# Patient Record
Sex: Female | Born: 1975 | Race: White | Hispanic: No | Marital: Single | Smoking: Never smoker
Health system: Southern US, Community
[De-identification: ages and names within clinical notes are randomized; demographics above are authoritative.]

## PROBLEM LIST (undated history)

## (undated) ENCOUNTER — Ambulatory Visit: Admission: EM | Payer: Self-pay | Source: Home / Self Care

## (undated) DIAGNOSIS — N390 Urinary tract infection, site not specified: Secondary | ICD-10-CM

## (undated) HISTORY — PX: APPENDECTOMY: SHX54

## (undated) HISTORY — PX: ABLATION: SHX5711

---

## 2013-01-12 ENCOUNTER — Emergency Department: Payer: Self-pay | Admitting: Emergency Medicine

## 2013-05-25 ENCOUNTER — Emergency Department: Payer: Self-pay | Admitting: Emergency Medicine

## 2014-08-02 ENCOUNTER — Emergency Department: Payer: Self-pay | Admitting: Emergency Medicine

## 2015-01-24 ENCOUNTER — Emergency Department
Admit: 2015-01-24 | Disposition: A | Discharge status: Home / Self Care | Payer: Self-pay | Admitting: Emergency Medicine

## 2015-01-24 LAB — URINALYSIS, COMPLETE
Bilirubin,UR: NEGATIVE
Glucose,UR: NEGATIVE mg/dL (ref 0–75)
KETONE: NEGATIVE
Nitrite: NEGATIVE
PH: 5 (ref 4.5–8.0)
SPECIFIC GRAVITY: 1.023 (ref 1.003–1.030)

## 2015-03-11 ENCOUNTER — Emergency Department
Admission: EM | Admit: 2015-03-11 | Discharge: 2015-03-11 | Disposition: A | Discharge status: Home / Self Care | Payer: Medicaid Other | Attending: Emergency Medicine | Admitting: Emergency Medicine

## 2015-03-11 ENCOUNTER — Encounter: Payer: Self-pay | Admitting: Emergency Medicine

## 2015-03-11 DIAGNOSIS — Z87440 Personal history of urinary (tract) infections: Secondary | ICD-10-CM | POA: Insufficient documentation

## 2015-03-11 DIAGNOSIS — Z3202 Encounter for pregnancy test, result negative: Secondary | ICD-10-CM | POA: Diagnosis not present

## 2015-03-11 DIAGNOSIS — R300 Dysuria: Secondary | ICD-10-CM | POA: Insufficient documentation

## 2015-03-11 HISTORY — DX: Urinary tract infection, site not specified: N39.0

## 2015-03-11 LAB — URINALYSIS COMPLETE WITH MICROSCOPIC (ARMC ONLY)
Bacteria, UA: NONE SEEN
Bilirubin Urine: NEGATIVE
Glucose, UA: NEGATIVE mg/dL
Hgb urine dipstick: NEGATIVE
Ketones, ur: NEGATIVE mg/dL
LEUKOCYTES UA: NEGATIVE
Nitrite: NEGATIVE
Protein, ur: NEGATIVE mg/dL
SPECIFIC GRAVITY, URINE: 1.001 — AB (ref 1.005–1.030)
pH: 6 (ref 5.0–8.0)

## 2015-03-11 LAB — POCT PREGNANCY, URINE: PREG TEST UR: NEGATIVE

## 2015-03-11 MED ORDER — PHENAZOPYRIDINE HCL 200 MG PO TABS
ORAL_TABLET | ORAL | Status: AC
  Administered 2015-03-11: 200 mg via ORAL
  Filled 2015-03-11: qty 1

## 2015-03-11 MED ORDER — PHENAZOPYRIDINE HCL 200 MG PO TABS
200.0000 mg | ORAL_TABLET | Freq: Once | ORAL | Status: AC
  Administered 2015-03-11: 200 mg via ORAL

## 2015-03-11 NOTE — ED Provider Notes (Signed)
[facility] Emergency Department Provider Note  ____________________________________________  Time seen: 4:20 AM  I have reviewed the triage vital signs and the nursing notes.   HISTORY  Chief Complaint Urinary Tract Infection      HPI Meagan Guzman is a 39 y.o. female presents with dysuria times one hour. Patient denies any fever no nausea no vomiting no back pain. Patient admits to multiple urinary tract infections most recent of which was last month.     Past Medical History  Diagnosis Date  . UTI (lower urinary tract infection)     There are no active problems to display for this patient.   Past Surgical History  Procedure Laterality Date  . Ablation    . Appendectomy      No current outpatient prescriptions on file.  Allergies Review of patient's allergies indicates no known allergies.  History reviewed. No pertinent family history.  Social History History  Substance Use Topics  . Smoking status: Never Smoker   . Smokeless tobacco: Not on file  . Alcohol Use: Yes    Review of Systems  Constitutional: Negative for fever. Eyes: Negative for visual changes. ENT: Negative for sore throat. Cardiovascular: Negative for chest pain. Respiratory: Negative for shortness of breath. Gastrointestinal: Negative for abdominal pain, vomiting and diarrhea. Genitourinary: Negative for dysuria. Musculoskeletal: Negative for back pain. Skin: Negative for rash. Neurological: Negative for headaches, focal weakness or numbness.   10-point ROS otherwise negative.  ____________________________________________   PHYSICAL EXAM:  VITAL SIGNS: ED Triage Vitals  Enc Vitals Group     BP 03/11/15 0150 98/65 mmHg     Pulse Rate 03/11/15 0150 70     Resp 03/11/15 0150 18     Temp 03/11/15 0150 97.7 F (36.5 C)     Temp Source 03/11/15 0150 Oral     SpO2 03/11/15 0150 97 %     Weight 03/11/15 0150 188 lb (85.276 kg)     Height 03/11/15 0150   (1.626 m)     Head Cir --      Peak Flow --      Pain Score 03/11/15 0153 3     Pain Loc --      Pain Edu? --      Excl. in GC? --      Constitutional: Alert and oriented. Well appearing and in no distress. Eyes: Conjunctivae are normal. PERRL. Normal extraocular movements. ENT   Head: Normocephalic and atraumatic.   Nose: No congestion/rhinnorhea.   Mouth/Throat: Mucous membranes are moist.   Neck: No stridor. Cardiovascular: Normal rate, regular rhythm. Normal and symmetric distal pulses are present in all extremities. No murmurs, rubs, or gallops. Respiratory: Normal respiratory effort without tachypnea nor retractions. Breath sounds are clear and equal bilaterally. No wheezes/rales/rhonchi. Gastrointestinal: Soft and nontender. No distention. There is no CVA tenderness. Genitourinary: deferred Musculoskeletal: Nontender with normal range of motion in all extremities. No joint effusions.  No lower extremity tenderness nor edema. Neurologic:  Normal speech and language. No gross focal neurologic deficits are appreciated. Speech is normal.  Skin:  Skin is warm, dry and intact. No rash noted. Psychiatric: Mood and affect are normal. Speech and behavior are normal. Patient exhibits appropriate insight and judgment.  ____________________________________________    LABS (pertinent positives/negatives)  Labs Reviewed  URINALYSIS COMPLETEWITH MICROSCOPIC ([facility] ONLY) - Abnormal; Notable for the following:    Color, Urine COLORLESS (*)    APPearance CLEAR (*)    Specific Gravity, Urine 1.001 (*)  Squamous Epithelial / LPF 0-5 (*)    All other components within normal limits  POC URINE PREG, ED  POCT PREGNANCY, URINE     ____________________________________________     INITIAL IMPRESSION / ASSESSMENT AND PLAN / ED COURSE  Pertinent labs & imaging results that were available during my care of the patient were reviewed by me and considered in my medical  decision making (see chart for details).  History of physical exam consistent with dysuria however urinalysis revealed no evidence of urinary tract infection. Patient stated that she just had a Pap smear done with cultures taken on Friday and she is awaiting results as such repeat Pap exam not performed. Urged patient to follow up on results from Dr. Crista CurbEvers regarding her Pap smear.  ____________________________________________   FINAL CLINICAL IMPRESSION(S) / ED DIAGNOSES  Final diagnoses:  Dysuria      Darci Currentandolph N Elvera Almario, MD 03/11/15 [phone]

## 2015-03-11 NOTE — ED Notes (Signed)
Pt states she had a UTI in April/May and completed all her antibiotics.

## 2015-03-11 NOTE — Discharge Instructions (Signed)

## 2015-03-11 NOTE — ED Notes (Signed)
Pt arrived to the ED for complaints of dysuria. Pt states: "I have a UTI, I get them regularly and it always start like this." Pt reports having an UTI last month. Pt is AOx4 in no apparent distress.

## 2015-04-15 ENCOUNTER — Encounter: Payer: Self-pay | Admitting: Emergency Medicine

## 2015-04-15 ENCOUNTER — Emergency Department: Payer: Medicaid Other

## 2015-04-15 DIAGNOSIS — M79641 Pain in right hand: Secondary | ICD-10-CM | POA: Insufficient documentation

## 2015-04-15 NOTE — ED Notes (Signed)
Pt presents to ER alert and in NAD. pt reports hand pain intermittently over the last few days, Denies injury. Pt moving hand normally in triage.

## 2015-04-16 ENCOUNTER — Emergency Department
Admission: EM | Admit: 2015-04-16 | Discharge: 2015-04-16 | Discharge status: Against Medical Advice | Payer: Medicaid Other | Attending: Student | Admitting: Student

## 2015-06-22 ENCOUNTER — Emergency Department
Admission: EM | Admit: 2015-06-22 | Discharge: 2015-06-22 | Disposition: A | Discharge status: Home / Self Care | Payer: Medicaid Other | Attending: Emergency Medicine | Admitting: Emergency Medicine

## 2015-06-22 DIAGNOSIS — N390 Urinary tract infection, site not specified: Secondary | ICD-10-CM | POA: Insufficient documentation

## 2015-06-22 LAB — URINALYSIS COMPLETE WITH MICROSCOPIC (ARMC ONLY): SPECIFIC GRAVITY, URINE: 1.008 (ref 1.005–1.030)

## 2015-06-22 MED ORDER — SULFAMETHOXAZOLE-TRIMETHOPRIM 800-160 MG PO TABS
1.0000 | ORAL_TABLET | Freq: Once | ORAL | Status: AC
  Administered 2015-06-22: 1 via ORAL
  Filled 2015-06-22: qty 1

## 2015-06-22 MED ORDER — SULFAMETHOXAZOLE-TRIMETHOPRIM 800-160 MG PO TABS: 1.0000 | ORAL_TABLET | Freq: Two times a day (BID) | ORAL | Status: DC

## 2015-06-22 NOTE — ED Notes (Signed)
Pt presents with c/o urinary frequency and dysuria x 3 days,   She started taking AZO with no relief.   Today she is also c/o pain right lower back.

## 2015-06-22 NOTE — Discharge Instructions (Signed)
Urinary Tract Infection Urinary tract infections (UTIs) can develop anywhere along your urinary tract. Your urinary tract is your body's drainage system for removing wastes and extra water. Your urinary tract includes two kidneys, two ureters, a bladder, and a urethra. Your kidneys are a pair of bean-shaped organs. Each kidney is about the size of your fist. They are located below your ribs, one on each side of your spine. CAUSES Infections are caused by microbes, which are microscopic organisms, including fungi, viruses, and bacteria. These organisms are so small that they can only be seen through a microscope. Bacteria are the microbes that most commonly cause UTIs. SYMPTOMS  Symptoms of UTIs may vary by age and gender of the patient and by the location of the infection. Symptoms in young women typically include a frequent and intense urge to urinate and a painful, burning feeling in the bladder or urethra during urination. Older women and men are more likely to be tired, shaky, and weak and have muscle aches and abdominal pain. A fever may mean the infection is in your kidneys. Other symptoms of a kidney infection include pain in your back or sides below the ribs, nausea, and vomiting. DIAGNOSIS To diagnose a UTI, your caregiver will ask you about your symptoms. Your caregiver also will ask to provide a urine sample. The urine sample will be tested for bacteria and white blood cells. White blood cells are made by your body to help fight infection. TREATMENT  Typically, UTIs can be treated with medication. Because most UTIs are caused by a bacterial infection, they usually can be treated with the use of antibiotics. The choice of antibiotic and length of treatment depend on your symptoms and the type of bacteria causing your infection. HOME CARE INSTRUCTIONS  If you were prescribed antibiotics, take them exactly as your caregiver instructs you. Finish the medication even if you feel better after you  have only taken some of the medication.  Drink enough water and fluids to keep your urine clear or pale yellow.  Avoid caffeine, tea, and carbonated beverages. They tend to irritate your bladder.  Empty your bladder often. Avoid holding urine for long periods of time.  Empty your bladder before and after sexual intercourse.  After a bowel movement, women should cleanse from front to back. Use each tissue only once. SEEK MEDICAL CARE IF:   You have back pain.  You develop a fever.  Your symptoms do not begin to resolve within 3 days. SEEK IMMEDIATE MEDICAL CARE IF:   You have severe back pain or lower abdominal pain.  You develop chills.  You have nausea or vomiting.  You have continued burning or discomfort with urination. MAKE SURE YOU:   Understand these instructions.  Will watch your condition.  Will get help right away if you are not doing well or get worse. Document Released: 06/30/2005 Document Revised: 03/21/2012 Document Reviewed: 10/29/2011 [facility] Patient Information 2015 [place], [place]. This information is not intended to replace advice given to you by your health care provider. Make sure you discuss any questions you have with your health care provider.  Take the antibiotic as directed until completely gone. You may receive a call from the hospital if you are required to change your antibiotic based on the urine culture results.

## 2015-06-22 NOTE — ED Provider Notes (Signed)
[facility] Emergency Department Roshawna Colclasure Note ____________________________________________  Time seen: 40  I have reviewed the triage vital signs and the nursing notes.  HISTORY  Chief Complaint  Urinary Urgency  HPI Meagan Guzman is a 39 y.o. female reports to the ED with complaints of urinary frequency and dysuria for the last 3 days. She did take an over-the-counter AZO without relief to her symptoms. Today she began to experience some right lower back pain. She denies any fevers, chills, sweats, or nausea or vomiting.  Past Medical History  Diagnosis Date  . UTI (lower urinary tract infection)    There are no active problems to display for this patient.  Past Surgical History  Procedure Laterality Date  . Ablation    . Appendectomy      Current Outpatient Rx  Name  Route  Sig  Dispense  Refill  . sulfamethoxazole-trimethoprim (BACTRIM DS,SEPTRA DS) 800-160 MG per tablet   Oral   Take 1 tablet by mouth 2 (two) times daily.   14 tablet   0    Allergies Review of patient's allergies indicates no known allergies.  No family history on file.  Social History Social History  Substance Use Topics  . Smoking status: Never Smoker   . Smokeless tobacco: Not on file  . Alcohol Use: Yes   Review of Systems  Constitutional: Negative for fever. Eyes: Negative for visual changes. ENT: Negative for sore throat. Cardiovascular: Negative for chest pain. Respiratory: Negative for shortness of breath. Gastrointestinal: Negative for abdominal pain, vomiting and diarrhea. Genitourinary: positive for dysuria, frequency.  Musculoskeletal: Negative for back pain. Reports flank pain Skin: Negative for rash. Neurological: Negative for headaches, focal weakness or numbness. ____________________________________________  PHYSICAL EXAM:  VITAL SIGNS: ED Triage Vitals  Enc Vitals Group     BP 06/22/15 1745 137/76 mmHg     Pulse Rate 06/22/15 1745 63    Resp 06/22/15 1745 18     Temp 06/22/15 1745 98.3 F (36.8 C)     Temp Source 06/22/15 1745 Oral     SpO2 06/22/15 1745 98 %     Weight 06/22/15 1745 177 lb (80.287 kg)     Height 06/22/15 1745  (1.626 m)     Head Cir --      Peak Flow --      Pain Score 06/22/15 1749 6     Pain Loc --      Pain Edu? --      Excl. in GC? --    Constitutional: Alert and oriented. Well appearing and in no distress. Eyes: Conjunctivae are normal. PERRL. Normal extraocular movements. ENT   Head: Normocephalic and atraumatic.   Nose: No congestion/rhinorrhea.   Mouth/Throat: Mucous membranes are moist.   Neck: Supple. No thyromegaly. Hematological/Lymphatic/Immunological: No cervical lymphadenopathy. Cardiovascular: Normal rate, regular rhythm.  Respiratory: Normal respiratory effort. No wheezes/rales/rhonchi. Gastrointestinal: Soft and nontender. No distention. Mild right flank pain  Musculoskeletal: Nontender with normal range of motion in all extremities.  Neurologic:  Normal gait without ataxia. Normal speech and language. No gross focal neurologic deficits are appreciated. Skin:  Skin is warm, dry and intact. No rash noted. Psychiatric: Mood and affect are normal. Patient exhibits appropriate insight and judgment. ____________________________________________    LABS (pertinent positives/negatives) Labs Reviewed  URINALYSIS COMPLETEWITH MICROSCOPIC ([facility] ONLY) - Abnormal; Notable for the following:    Color, Urine ORANGE (*)    APPearance HAZY (*)    Glucose, UA   (*)  Value: TEST NOT REPORTED DUE TO COLOR INTERFERENCE OF URINE PIGMENT   Bilirubin Urine   (*)    Value: TEST NOT REPORTED DUE TO COLOR INTERFERENCE OF URINE PIGMENT   Ketones, ur   (*)    Value: TEST NOT REPORTED DUE TO COLOR INTERFERENCE OF URINE PIGMENT   Hgb urine dipstick   (*)    Value: TEST NOT REPORTED DUE TO COLOR INTERFERENCE OF URINE PIGMENT   Protein, ur   (*)    Value: TEST NOT REPORTED DUE TO  COLOR INTERFERENCE OF URINE PIGMENT   Nitrite   (*)    Value: TEST NOT REPORTED DUE TO COLOR INTERFERENCE OF URINE PIGMENT   Leukocytes, UA   (*)    Value: TEST NOT REPORTED DUE TO COLOR INTERFERENCE OF URINE PIGMENT   Bacteria, UA RARE (*)    Squamous Epithelial / LPF 0-5 (*)    All other components within normal limits  Urine WBCs - TNTC ____________________________________________  PROCEDURES  Bactrim DS 1 PO ____________________________________________  INITIAL IMPRESSION / ASSESSMENT AND PLAN / ED COURSE  Treatment for acute urinary tract infection with Bactrim twice a day 7 days. Urine culture pending. Patient is up with a primary care Diamon Reddinger as needed. ____________________________________________  FINAL CLINICAL IMPRESSION(S) / ED DIAGNOSES  Final diagnoses:  UTI (lower urinary tract infection)      Lissa Hoard, PA-C 06/22/15 2356  Emily Filbert, MD 06/25/15 [phone]

## 2019-06-01 ENCOUNTER — Ambulatory Visit
Admission: EM | Admit: 2019-06-01 | Discharge: 2019-06-01 | Disposition: A | Discharge status: Home / Self Care | Payer: Worker's Compensation | Attending: Family Medicine | Admitting: Family Medicine

## 2019-06-01 ENCOUNTER — Encounter: Payer: Self-pay | Admitting: Emergency Medicine

## 2019-06-01 ENCOUNTER — Ambulatory Visit (INDEPENDENT_AMBULATORY_CARE_PROVIDER_SITE_OTHER): Payer: Worker's Compensation

## 2019-06-01 ENCOUNTER — Other Ambulatory Visit: Payer: Self-pay

## 2019-06-01 DIAGNOSIS — Y990 Civilian activity done for income or pay: Secondary | ICD-10-CM | POA: Diagnosis not present

## 2019-06-01 DIAGNOSIS — W2209XA Striking against other stationary object, initial encounter: Secondary | ICD-10-CM

## 2019-06-01 DIAGNOSIS — M542 Cervicalgia: Secondary | ICD-10-CM

## 2019-06-01 DIAGNOSIS — S161XXA Strain of muscle, fascia and tendon at neck level, initial encounter: Secondary | ICD-10-CM | POA: Diagnosis not present

## 2019-06-01 MED ORDER — MELOXICAM 15 MG PO TABS: 15.0000 mg | ORAL_TABLET | Freq: Every day | ORAL | 0 refills | Status: DC | PRN

## 2019-06-01 MED ORDER — METAXALONE 800 MG PO TABS: 800.0000 mg | ORAL_TABLET | Freq: Three times a day (TID) | ORAL | 0 refills | Status: DC | PRN

## 2019-06-01 NOTE — ED Provider Notes (Signed)
[facility]    CSN: [id number] Arrival date & time: 06/01/19  1434   History   Chief Complaint Chief Complaint  Patient presents with  . Head Injury   HPI  43 year old female presents with the above complaint.  Patient states that she was injured at work on Saturday.  Patient states that she accidentally hit her head on a wall.  In doing so she injured her neck.  She reports intermittent headaches and continued left-sided neck pain.  Patient denies numbness/tingling/paresthesias.  She has taken [facility] powder and ibuprofen without resolution.  Seems to be worse with movement.  Pain is currently 4/10 in severity.  No relieving factors.  No other associated symptoms.  No other complaints.  PMH, Surgical Hx, Family Hx, Social History reviewed and updated as below.  PMH: GERD, Fibroid, Anxiety  Past Surgical History:  Procedure Laterality Date  . ABLATION    . APPENDECTOMY      OB History    Gravida  2   Para  1   Term  0   Preterm  1   AB  1   Living  1     SAB  0   TAB  0   Ectopic  0   Multiple  0   Live Births               Home Medications    Prior to Admission medications   Medication Sig Start Date End Date Taking? Authorizing Provider  meloxicam (MOBIC) 15 MG tablet Take 1 tablet (15 mg total) by mouth daily as needed for pain. 06/01/19   Coral Spikes, DO  metaxalone (SKELAXIN) 800 MG tablet Take 1 tablet (800 mg total) by mouth 3 (three) times daily as needed for muscle spasms. 06/01/19   Coral Spikes, DO  sulfamethoxazole-trimethoprim (BACTRIM DS,SEPTRA DS) 800-160 MG per tablet Take 1 tablet by mouth 2 (two) times daily. 06/22/15   Menshew, Dannielle Karvonen, PA-C    Family History Family History  Problem Relation Age of Onset  . Healthy Mother   . Heart failure Father   . Cancer Maternal Grandmother     Social History Social History   Tobacco Use  . Smoking status: Never Smoker  . Smokeless tobacco: Never Used  Substance Use  Topics  . Alcohol use: Not Currently  . Drug use: No     Allergies   Patient has no known allergies.   Review of Systems Review of Systems  Musculoskeletal: Positive for neck pain.  Neurological: Positive for headaches.   Physical Exam Triage Vital Signs ED Triage Vitals  Enc Vitals Group     BP 06/01/19 1521 110/69     Pulse Rate 06/01/19 1521 62     Resp 06/01/19 1521 16     Temp 06/01/19 1521 98.2 F (36.8 C)     Temp src --      SpO2 06/01/19 1521 100 %     Weight 06/01/19 1523 155 lb (70.3 kg)     Height 06/01/19 1523 5\' 4"  (1.626 m)     Head Circumference --      Peak Flow --      Pain Score 06/01/19 1522 4     Pain Loc --      Pain Edu? --      Excl. in [place]? --    Updated Vital Signs BP 110/69 (BP Location: Left Arm)   Pulse 62   Temp 98.2 F (36.8 C)  Resp 16   Ht 5\' 4"  (1.626 m)   Wt 70.3 kg   SpO2 100%   BMI 26.61 kg/m   Visual Acuity Right Eye Distance:   Left Eye Distance:   Bilateral Distance:    Right Eye Near:   Left Eye Near:    Bilateral Near:     Physical Exam Vitals signs and nursing note reviewed.  Constitutional:      General: She is not in acute distress.    Appearance: Normal appearance. She is not ill-appearing.  HENT:     Head: Normocephalic and atraumatic.  Eyes:     General:        Right eye: No discharge.        Left eye: No discharge.     Conjunctiva/sclera: Conjunctivae normal.  Neck:     Comments: Patient with tenderness of the left paraspinal musculature. Cardiovascular:     Rate and Rhythm: Normal rate and regular rhythm.     Heart sounds: No murmur.  Pulmonary:     Effort: Pulmonary effort is normal.     Breath sounds: Normal breath sounds. No wheezing, rhonchi or rales.  Neurological:     General: No focal deficit present.     Mental Status: She is alert and oriented to person, place, and time.  Psychiatric:        Mood and Affect: Mood normal.        Behavior: Behavior normal.    UC Treatments /  Results  Labs (all labs ordered are listed, but only abnormal results are displayed) Labs Reviewed - No data to display  EKG   Radiology Dg Cervical Spine Complete  Result Date: 06/01/2019 CLINICAL DATA:  Headaches and left-sided neck pain after hitting top of head at work 6 days ago. EXAM: CERVICAL SPINE - COMPLETE 4+ VIEW COMPARISON:  Cervical spine x-rays dated May 25, 2013. FINDINGS: The lateral view is diagnostic to the C7-T1 level. There is no acute fracture or subluxation. Vertebral body heights are preserved. Unchanged reversal of the normal cervical lordosis. Sagittal alignment is normal. Progressive mild disc height loss anteriorly at C5-C6. Remaining intervertebral disc spaces are maintained. Scattered uncovertebral hypertrophy, most prominent on the right at C3-C4 and on the left at C4-C5. No significant bony neuroforaminal stenosis.Normal prevertebral soft tissues. IMPRESSION: 1.  No acute osseous abnormality. 2. Mild cervical spondylosis. Electronically Signed   By: Obie Dredge M.D.   On: 06/01/2019 16:11    Procedures Procedures (including critical care time)  Medications Ordered in UC Medications - No data to display  Initial Impression / Assessment and Plan / UC Course  I have reviewed the triage vital signs and the nursing notes.  Pertinent labs & imaging results that were available during my care of the patient were reviewed by me and considered in my medical decision making (see chart for details).    43 year old female presents with an acute neck strain.  X-ray negative for fracture.  Meloxicam and Skelaxin as directed.  May return to work.  Workmen's Comp. form filled out.  Final Clinical Impressions(s) / UC Diagnoses   Final diagnoses:  Acute strain of neck muscle, initial encounter     Discharge Instructions     Medication as prescribed.  Take care  Dr. Adriana Simas     ED Prescriptions    Medication Sig Dispense Auth. Provider   meloxicam (MOBIC)  15 MG tablet Take 1 tablet (15 mg total) by mouth daily as needed for pain. 30 tablet  Caedmon Louque G, DO   metaxalone (SKELAXIN) 800 MG tablet Take 1 tablet (800 mg total) by mouth 3 (three) times daily as needed for muscle spasms. 30 tablet Tommie Samsook, Marcellas Marchant G, DO     Controlled Substance Prescriptions [place] Controlled Substance Registry consulted? Not Applicable   Tommie SamsCook, Leeah Politano G, DO 06/01/19 1930

## 2019-06-01 NOTE — ED Triage Notes (Signed)
Patient states she hit the top of her head on a shelf while at work last Saturday.  Patient states she has had a headache and neck pain

## 2019-06-01 NOTE — Discharge Instructions (Signed)
Medication as prescribed.  Take care  Dr. Veronica Fretz  

## 2019-07-08 ENCOUNTER — Other Ambulatory Visit: Payer: Self-pay

## 2019-07-08 ENCOUNTER — Encounter: Payer: Self-pay | Admitting: Emergency Medicine

## 2019-07-08 ENCOUNTER — Ambulatory Visit
Admission: EM | Admit: 2019-07-08 | Discharge: 2019-07-08 | Disposition: A | Discharge status: Home / Self Care | Payer: BC Managed Care – PPO

## 2019-07-08 DIAGNOSIS — Z0289 Encounter for other administrative examinations: Secondary | ICD-10-CM | POA: Diagnosis not present

## 2019-07-08 DIAGNOSIS — R519 Headache, unspecified: Secondary | ICD-10-CM | POA: Diagnosis not present

## 2019-07-08 NOTE — ED Triage Notes (Signed)
Patient c/o HA and diarrhea that started this morning.  Patient states that she called out of work today and needs a work note. Patient c/o sinus pressure and pain behind her eyes.  Patient denies fevers.

## 2019-07-08 NOTE — Discharge Instructions (Addendum)
Recommend continue BC powder or Excedrin migraine as needed for headache. Rest. Follow-up if headache does not improve or vomiting continues.

## 2019-07-09 NOTE — ED Provider Notes (Signed)
[facility]    CSN: [id number] Arrival date & time: 07/08/19  1520      History   Chief Complaint Chief Complaint  Patient presents with  . Headache  . Sinus Problem    HPI Meagan Guzman is a 43 y.o. female.   43 year old female presents with sinus/migraine headache, vomiting and loose stools this morning. She denies any fever, distinct nasal congestion, sore throat, or cough. She indicates that she often has "sinus headaches" and previously took Ibuprofen with minimal relief but [facility] with some relief. Headaches get so bad she usually needs to lie down and occasionally she will be nauseous and vomit. Denies any visual changes or dizziness. She called out of work today due to symptoms and needs a note to return. No other chronic health issues. Does not smoke tobacco or use illicit drugs. Takes a multivitamin daily.   The history is provided by the patient.    Past Medical History:  Diagnosis Date  . UTI (lower urinary tract infection)     There are no active problems to display for this patient.   Past Surgical History:  Procedure Laterality Date  . ABLATION    . APPENDECTOMY      OB History    Gravida  2   Para  1   Term  0   Preterm  1   AB  1   Living  1     SAB  0   TAB  0   Ectopic  0   Multiple  0   Live Births               Home Medications    Prior to Admission medications   Medication Sig Start Date End Date Taking? Authorizing Provider  Multiple Vitamin (MULTI-VITAMIN) tablet Take by mouth.    [provider]    Family History Family History  Problem Relation Age of Onset  . Healthy Mother   . Heart failure Father   . Cancer Maternal Grandmother     Social History Social History   Tobacco Use  . Smoking status: Never Smoker  . Smokeless tobacco: Never Used  Substance Use Topics  . Alcohol use: Not Currently  . Drug use: No     Allergies   Patient has no known allergies.    Review of Systems Review of Systems  Constitutional: Positive for appetite change and fatigue. Negative for activity change, chills, diaphoresis and fever.  HENT: Positive for sinus pressure and sinus pain. Negative for congestion, ear discharge, ear pain, facial swelling, mouth sores, nosebleeds, postnasal drip, rhinorrhea, sneezing, sore throat and trouble swallowing.   Eyes: Negative for photophobia, pain, discharge, redness, itching and visual disturbance.  Respiratory: Negative for cough, chest tightness, shortness of breath and wheezing.   Gastrointestinal: Positive for diarrhea, nausea and vomiting. Negative for abdominal pain.  Musculoskeletal: Negative for arthralgias, myalgias, neck pain and neck stiffness.  Skin: Negative for color change, rash and wound.  Allergic/Immunologic: Negative for environmental allergies, food allergies and immunocompromised state.  Neurological: Positive for headaches. Negative for dizziness, tremors, seizures, syncope, facial asymmetry, speech difficulty, weakness, light-headedness and numbness.  Hematological: Negative for adenopathy. Does not bruise/bleed easily.     Physical Exam Triage Vital Signs ED Triage Vitals  Enc Vitals Group     BP 07/08/19 1628 124/74     Pulse Rate 07/08/19 1628 63     Resp 07/08/19 1628 14  Temp 07/08/19 1628 98.4 F (36.9 C)     Temp Source 07/08/19 1628 Oral     SpO2 07/08/19 1628 100 %     Weight 07/08/19 1625 153 lb (69.4 kg)     Height 07/08/19 1625 5\' 4"  (1.626 m)     Head Circumference --      Peak Flow --      Pain Score 07/08/19 1624 4     Pain Loc --      Pain Edu? --      Excl. in GC? --    No data found.  Updated Vital Signs BP 124/74 (BP Location: Left Arm)   Pulse 63   Temp 98.4 F (36.9 C) (Oral)   Resp 14   Ht 5\' 4"  (1.626 m)   Wt 153 lb (69.4 kg)   SpO2 100%   BMI 26.26 kg/m   Visual Acuity Right Eye Distance:   Left Eye Distance:   Bilateral Distance:    Right Eye Near:    Left Eye Near:    Bilateral Near:     Physical Exam Vitals signs and nursing note reviewed.  Constitutional:      General: She is awake. She is not in acute distress.    Appearance: She is well-developed and well-groomed. She is not ill-appearing.     Comments: Patient sitting comfortably on exam table in no acute distress. Pain level was 8/10 when first coming to Urgent Care but has improved to 3 to 4/10 since taking [facility] Powder before leaving home.   HENT:     Head: Normocephalic and atraumatic.     Right Ear: Hearing, tympanic membrane, ear canal and external ear normal.     Left Ear: Hearing, tympanic membrane, ear canal and external ear normal.     Nose: No congestion or rhinorrhea.     Right Sinus: Frontal sinus tenderness present. No maxillary sinus tenderness.     Left Sinus: Frontal sinus tenderness present. No maxillary sinus tenderness.     Mouth/Throat:     Lips: Pink.     Mouth: Mucous membranes are moist.     Pharynx: Oropharynx is clear. Uvula midline. No pharyngeal swelling, oropharyngeal exudate, posterior oropharyngeal erythema or uvula swelling.  Eyes:     Extraocular Movements: Extraocular movements intact.     Conjunctiva/sclera: Conjunctivae normal.     Pupils: Pupils are equal, round, and reactive to light.  Neck:     Musculoskeletal: Normal range of motion and neck supple. No neck rigidity or muscular tenderness.  Cardiovascular:     Rate and Rhythm: Normal rate and regular rhythm.     Heart sounds: Normal heart sounds. No murmur.  Pulmonary:     Effort: Pulmonary effort is normal. No respiratory distress.     Breath sounds: Normal breath sounds and air entry. No decreased air movement. No decreased breath sounds, wheezing, rhonchi or rales.  Musculoskeletal: Normal range of motion.  Lymphadenopathy:     Cervical: No cervical adenopathy.  Skin:    General: Skin is warm and dry.     Findings: No rash.  Neurological:     General: No focal deficit present.      Mental Status: She is alert and oriented to person, place, and time.     Cranial Nerves: Cranial nerves are intact. No cranial nerve deficit or facial asymmetry.     Sensory: Sensation is intact. No sensory deficit.     Motor: Motor function is intact.  Coordination: Coordination is intact.     Gait: Gait is intact.  Psychiatric:        Mood and Affect: Mood normal.        Behavior: Behavior normal. Behavior is cooperative.        Thought Content: Thought content normal.        Judgment: Judgment normal.      UC Treatments / Results  Labs (all labs ordered are listed, but only abnormal results are displayed) Labs Reviewed - No data to display  EKG   Radiology No results found.  Procedures Procedures (including critical care time)  Medications Ordered in UC Medications - No data to display  Initial Impression / Assessment and Plan / UC Course  I have reviewed the triage vital signs and the nursing notes.  Pertinent labs & imaging results that were available during my care of the patient were reviewed by me and considered in my medical decision making (see chart for details).    Discussed with patient that she may have migraine headaches and not simply "sinus" headaches. Migraines can cause sinus congestion and frontal pain. May continue [facility] powder or use Excedrin migraine as needed since these treatments seem to work well at this time. Rest. Recommend keep diary of headaches. Since current symptoms are similar to previous symptoms, doubt other etiology or COVID 19. No testing done. Note written for work. Recommend follow-up if headache persists with continued nausea and vomiting.  Final Clinical Impressions(s) / UC Diagnoses   Final diagnoses:  Sinus headache  Headache disorder     Discharge Instructions     Recommend continue BC powder or Excedrin migraine as needed for headache. Rest. Follow-up if headache does not improve or vomiting continues.     ED  Prescriptions    None     PDMP not reviewed this encounter.   Katy Apo, NP 07/09/19 1310

## 2020-03-06 IMAGING — CR CERVICAL SPINE - COMPLETE 4+ VIEW
6 series · 6 of 6 positions shown · non-contrast
Comparison: Cervical spine x-rays dated May 25, 2013.

CLINICAL DATA: Headaches and left-sided neck pain after hitting top
of head at work 6 days ago.

EXAM:
CERVICAL SPINE - COMPLETE 4+ VIEW

[c-spine lat]
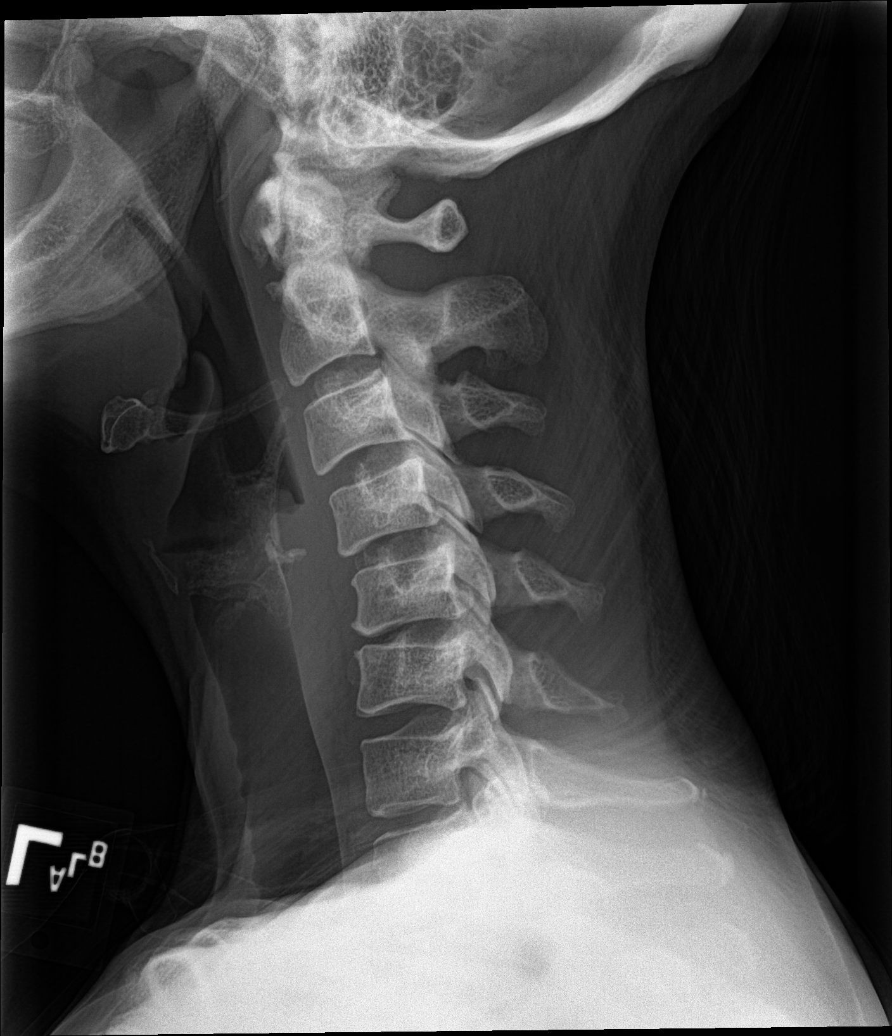

[c-spine obl (1 of 2)]
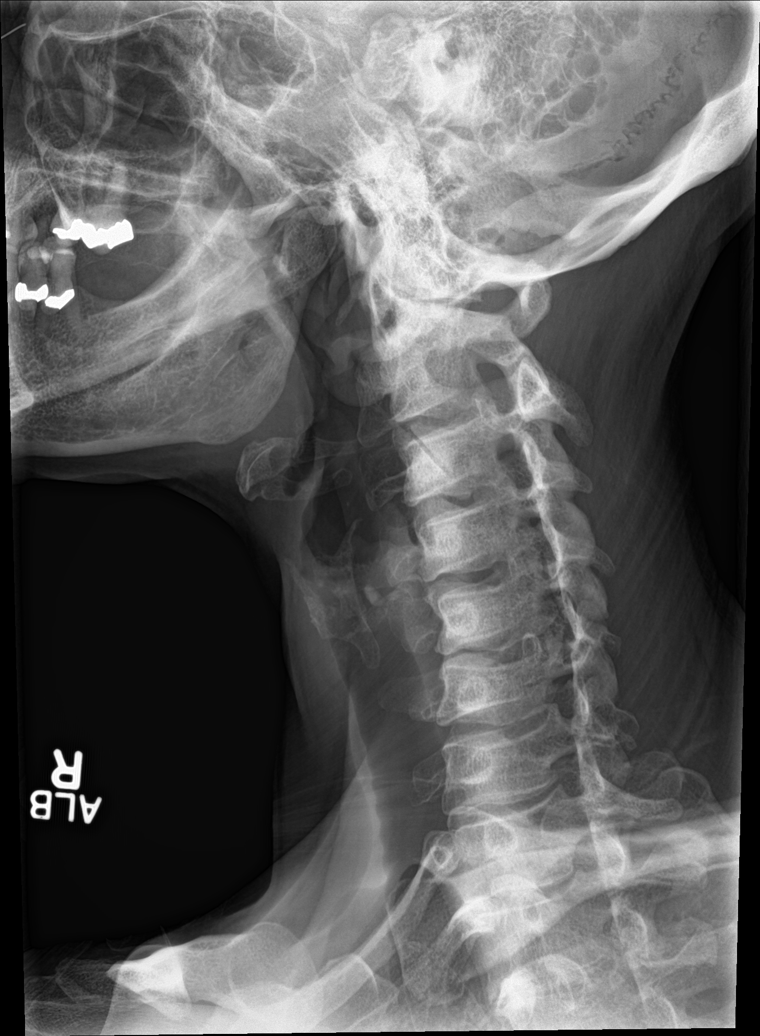

[c-spine obl (2 of 2)]
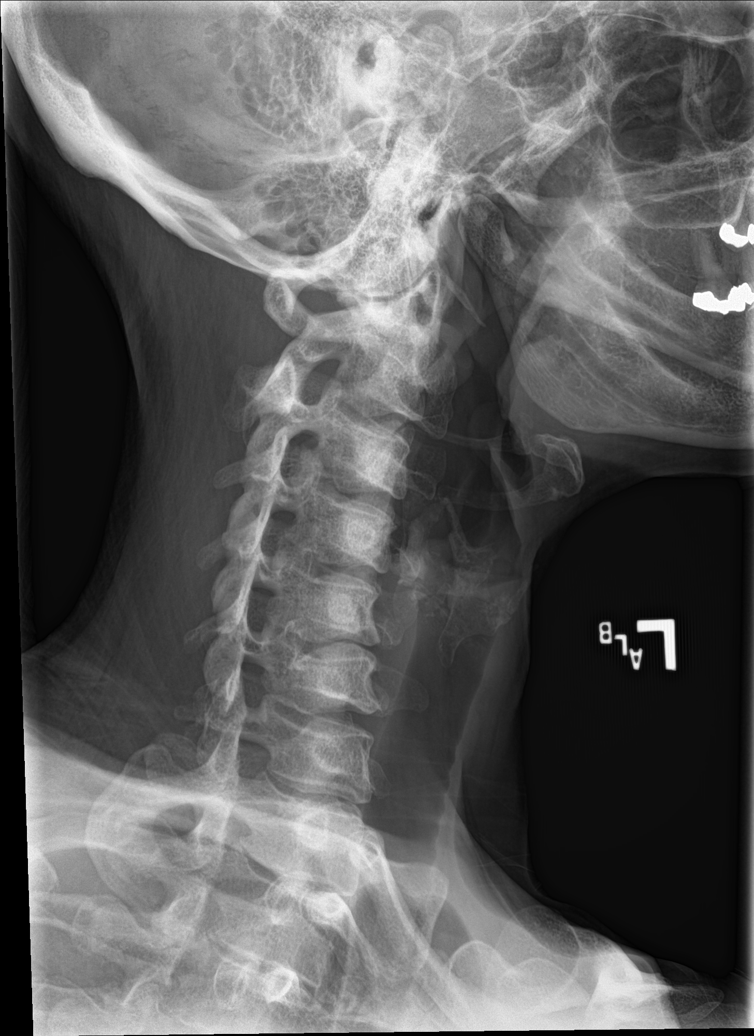

[c-spine ap]
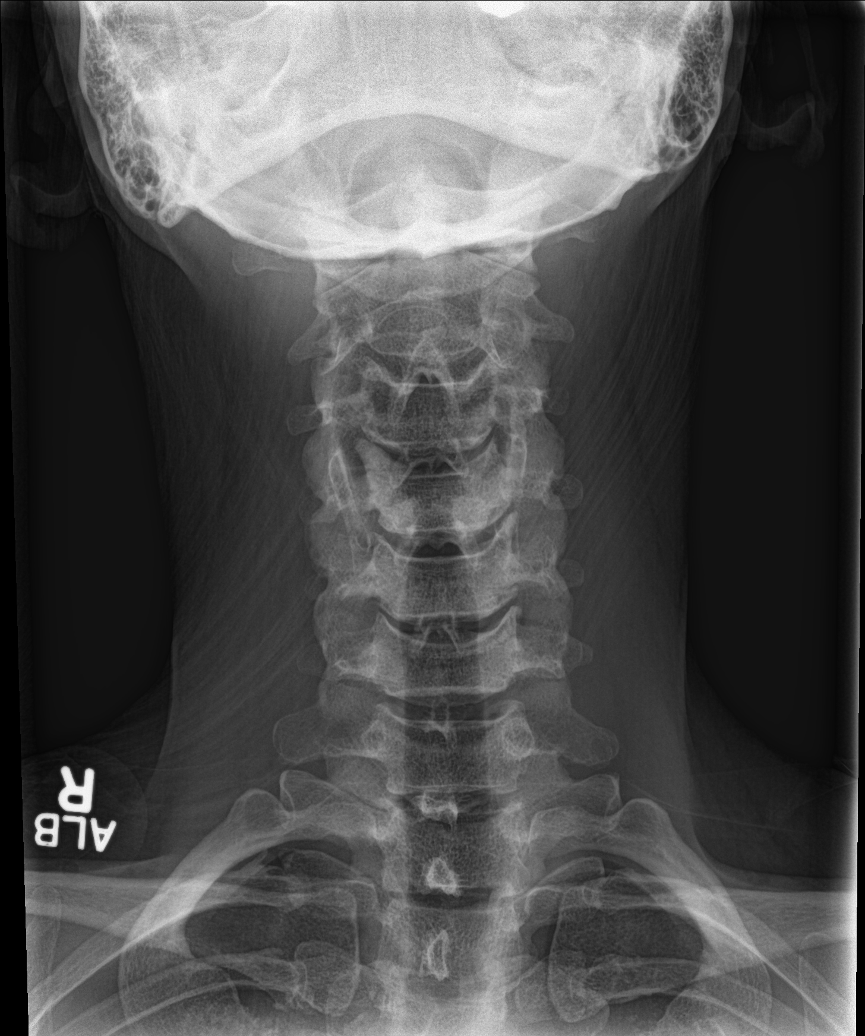

[c-spine open mouth]
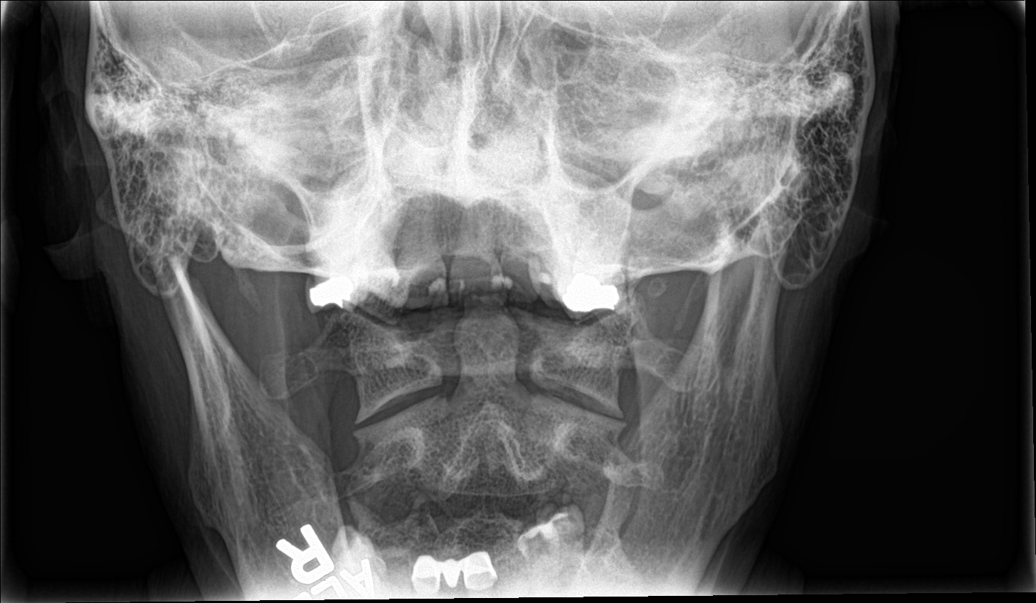

[[person_name]]
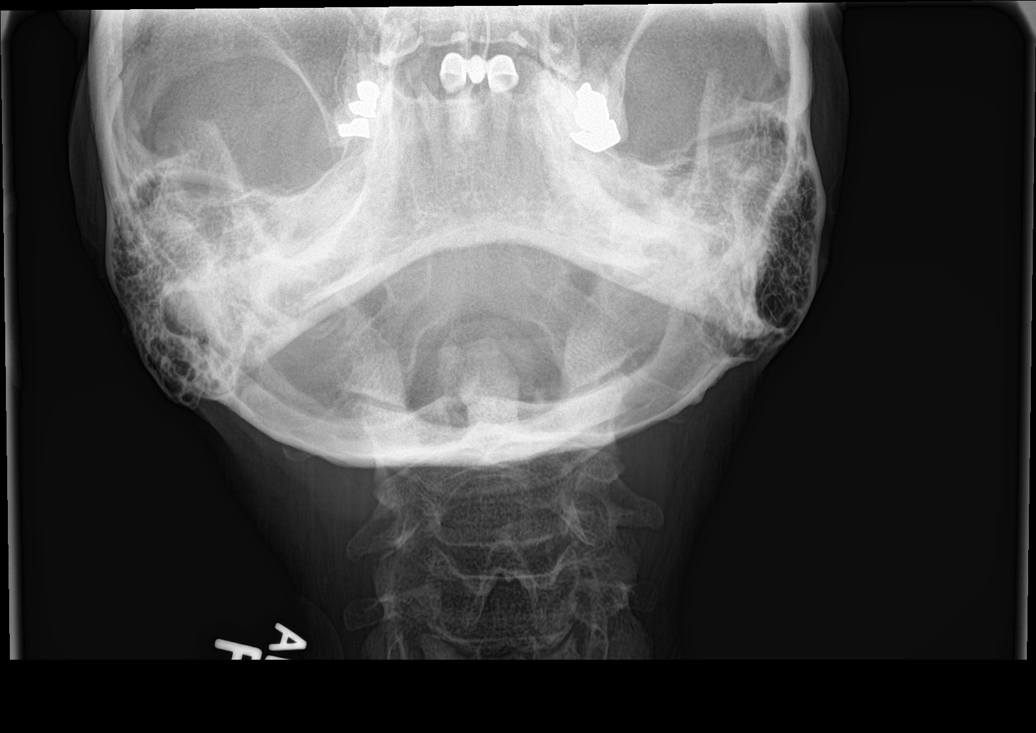

[6 of 6 positions shown; findings below may reference images not displayed]

FINDINGS: The lateral view is diagnostic to the C7-T1 level. There is no acute
fracture or subluxation. Vertebral body heights are preserved.
Unchanged reversal of the normal cervical lordosis. Sagittal
alignment is normal. Progressive mild disc height loss anteriorly at
C5-C6. Remaining intervertebral disc spaces are maintained.
Scattered uncovertebral hypertrophy, most prominent on the right at
C3-C4 and on the left at C4-C5. No significant bony neuroforaminal
stenosis.Normal prevertebral soft tissues.
IMPRESSION: 1.  No acute osseous abnormality.
2. Mild cervical spondylosis.

## 2020-09-02 ENCOUNTER — Telehealth (HOSPITAL_COMMUNITY): Payer: Self-pay | Admitting: Emergency Medicine

## 2020-09-02 NOTE — Telephone Encounter (Signed)
Left message regarding monoclonal antibody treatment for COVID-19 and call back number [phone].  

## 2020-09-09 ENCOUNTER — Ambulatory Visit
Admission: EM | Admit: 2020-09-09 | Discharge: 2020-09-09 | Disposition: A | Discharge status: Home / Self Care | Payer: BC Managed Care – PPO | Attending: Family Medicine | Admitting: Family Medicine

## 2020-09-09 ENCOUNTER — Encounter: Payer: Self-pay | Admitting: Emergency Medicine

## 2020-09-09 ENCOUNTER — Other Ambulatory Visit: Payer: Self-pay

## 2020-09-09 DIAGNOSIS — N3001 Acute cystitis with hematuria: Secondary | ICD-10-CM | POA: Diagnosis not present

## 2020-09-09 LAB — URINALYSIS, COMPLETE (UACMP) WITH MICROSCOPIC
Bilirubin Urine: NEGATIVE
Glucose, UA: NEGATIVE mg/dL
Ketones, ur: NEGATIVE mg/dL
Nitrite: NEGATIVE
Protein, ur: NEGATIVE mg/dL
Specific Gravity, Urine: 1.015 (ref 1.005–1.030)
WBC, UA: >50 WBC/hpf (ref 0–5)
pH: 7 (ref 5.0–8.0)

## 2020-09-09 MED ORDER — CEPHALEXIN 500 MG PO CAPS: 500.0000 mg | ORAL_CAPSULE | Freq: Two times a day (BID) | ORAL | 0 refills | Status: DC

## 2020-09-09 NOTE — ED Triage Notes (Addendum)
Patient in today c/o dysuria, urinary frequency and nausea x 3 days. Patient took AZO 2 days ago and felt some better, but the discomfort and nausea continues. Patient denies fever. Patient has also taken OTC Ibuprofen for her symptoms. Patient denies vaginal discharge.

## 2020-09-09 NOTE — Discharge Instructions (Signed)
Medication as prescribed.  Take care  Dr. Kimya Mccahill  

## 2020-09-09 NOTE — ED Provider Notes (Signed)
[facility]    CSN: [id number] Arrival date & time: 09/09/20  1515      History   Chief Complaint Chief Complaint  Patient presents with  . Dysuria  . Nausea   HPI  44 year old female presents with the above complaints.  Patient reports dysuria, urinary frequency and associated nausea over the past 3 days.  She has been taking Azo with improvement however her symptoms have not completely resolved.  No fever.  No abdominal pain or flank pain.  No back pain.  Possible decreased fluid intake as a reported inciting factor.  No other associated symptoms.  No other complaints.  Past Medical History:  Diagnosis Date  . UTI (lower urinary tract infection)    Past Surgical History:  Procedure Laterality Date  . ABLATION    . APPENDECTOMY      OB History    Gravida  2   Para  1   Term  0   Preterm  1   AB  1   Living  1     SAB  0   TAB  0   Ectopic  0   Multiple  0   Live Births               Home Medications    Prior to Admission medications   Medication Sig Start Date End Date Taking? Authorizing Provider  Multiple Vitamin (MULTI-VITAMIN) tablet Take by mouth.   Yes [provider]  cephALEXin (KEFLEX) 500 MG capsule Take 1 capsule (500 mg total) by mouth 2 (two) times daily. 09/09/20   Tommie Sams, DO    Family History Family History  Problem Relation Age of Onset  . Healthy Mother   . Heart failure Father   . Heart disease Father   . Cancer Maternal Grandmother     Social History Social History   Tobacco Use  . Smoking status: Never Smoker  . Smokeless tobacco: Never Used  Vaping Use  . Vaping Use: Never used  Substance Use Topics  . Alcohol use: Yes    Comment: occassional  . Drug use: No     Allergies   Patient has no known allergies.   Review of Systems Review of Systems  Gastrointestinal: Positive for nausea.  Genitourinary: Positive for dysuria and frequency.   Physical Exam Triage Vital  Signs ED Triage Vitals  Enc Vitals Group     BP 09/09/20 1526 119/81     Pulse Rate 09/09/20 1526 77     Resp 09/09/20 1526 18     Temp 09/09/20 1526 98.2 F (36.8 C)     Temp Source 09/09/20 1526 Oral     SpO2 09/09/20 1526 100 %     Weight 09/09/20 1527 163 lb (73.9 kg)     Height 09/09/20 1527 5\' 4"  (1.626 m)     Head Circumference --      Peak Flow --      Pain Score 09/09/20 1526 1     Pain Loc --      Pain Edu? --      Excl. in GC? --    Updated Vital Signs BP 119/81 (BP Location: Left Arm)   Pulse 77   Temp 98.2 F (36.8 C) (Oral)   Resp 18   Ht 5\' 4"  (1.626 m)   Wt 73.9 kg   SpO2 100%   BMI 27.98 kg/m   Visual Acuity Right Eye Distance:   Left Eye Distance:  Bilateral Distance:    Right Eye Near:   Left Eye Near:    Bilateral Near:     Physical Exam Vitals and nursing note reviewed.  Constitutional:      General: She is not in acute distress.    Appearance: Normal appearance. She is not ill-appearing.  HENT:     Head: Normocephalic and atraumatic.  Eyes:     General:        Right eye: No discharge.        Left eye: No discharge.     Conjunctiva/sclera: Conjunctivae normal.  Cardiovascular:     Rate and Rhythm: Normal rate and regular rhythm.  Pulmonary:     Effort: Pulmonary effort is normal.     Breath sounds: No wheezing, rhonchi or rales.  Abdominal:     General: There is no distension.     Palpations: Abdomen is soft.     Tenderness: There is no abdominal tenderness.  Neurological:     Mental Status: She is alert.  Psychiatric:        Mood and Affect: Mood normal.        Behavior: Behavior normal.    UC Treatments / Results  Labs (all labs ordered are listed, but only abnormal results are displayed) Labs Reviewed  URINALYSIS, COMPLETE (UACMP) WITH MICROSCOPIC - Abnormal; Notable for the following components:      Result Value   APPearance CLOUDY (*)    Hgb urine dipstick TRACE (*)    Leukocytes,Ua MODERATE (*)    Bacteria, UA  FEW (*)    All other components within normal limits  URINE CULTURE    EKG   Radiology No results found.  Procedures Procedures (including critical care time)  Medications Ordered in UC Medications - No data to display  Initial Impression / Assessment and Plan / UC Course  I have reviewed the triage vital signs and the nursing notes.  Pertinent labs & imaging results that were available during my care of the patient were reviewed by me and considered in my medical decision making (see chart for details).    44 year old female presents with UTI.  Sending culture.  Placing on Keflex.  Final Clinical Impressions(s) / UC Diagnoses   Final diagnoses:  Acute cystitis with hematuria     Discharge Instructions     Medication as prescribed.  Take care  Dr. Adriana Simas    ED Prescriptions    Medication Sig Dispense Auth. Provider   cephALEXin (KEFLEX) 500 MG capsule Take 1 capsule (500 mg total) by mouth 2 (two) times daily. 14 capsule Everlene Other G, DO     PDMP not reviewed this encounter.   Tommie Sams, [place] 09/09/20 [phone]

## 2020-09-11 LAB — URINE CULTURE

## 2020-09-12 LAB — URINE CULTURE: Culture: 70000 — AB

## 2020-12-11 ENCOUNTER — Encounter: Payer: Self-pay | Admitting: Emergency Medicine

## 2020-12-11 ENCOUNTER — Ambulatory Visit
Admission: EM | Admit: 2020-12-11 | Discharge: 2020-12-11 | Disposition: A | Discharge status: Home / Self Care | Payer: BC Managed Care – PPO | Attending: Physician Assistant | Admitting: Physician Assistant

## 2020-12-11 ENCOUNTER — Other Ambulatory Visit: Payer: Self-pay

## 2020-12-11 DIAGNOSIS — S39012A Strain of muscle, fascia and tendon of lower back, initial encounter: Secondary | ICD-10-CM | POA: Insufficient documentation

## 2020-12-11 LAB — URINALYSIS, COMPLETE (UACMP) WITH MICROSCOPIC
Bacteria, UA: NONE SEEN
Bilirubin Urine: NEGATIVE
Glucose, UA: NEGATIVE mg/dL
Hgb urine dipstick: NEGATIVE
Ketones, ur: NEGATIVE mg/dL
Leukocytes,Ua: NEGATIVE
Nitrite: NEGATIVE
Protein, ur: NEGATIVE mg/dL
RBC / HPF: NONE SEEN RBC/hpf (ref 0–5)
Specific Gravity, Urine: 1.015 (ref 1.005–1.030)
WBC, UA: NONE SEEN WBC/hpf (ref 0–5)
pH: 7.5 (ref 5.0–8.0)

## 2020-12-11 MED ORDER — CYCLOBENZAPRINE HCL 10 MG PO TABS: ORAL_TABLET | ORAL | 0 refills | Status: DC

## 2020-12-11 NOTE — ED Provider Notes (Signed)
[facility]    CSN: [id number] Arrival date & time: 12/11/20  1728      History   Chief Complaint Chief Complaint  Patient presents with  . Back Pain    HPI Meagan Guzman is a 45 y.o. female who presents with l flank pain which started after she turned in bed. She thought it was her kidney since she felt pain on flank area. Denies dysuria, frequency or urgency.  Pain is worse with spine movement. Does not radiate anywhere.    Past Medical History:  Diagnosis Date  . UTI (lower urinary tract infection)     There are no problems to display for this patient.   Past Surgical History:  Procedure Laterality Date  . ABLATION    . APPENDECTOMY      OB History    Gravida  2   Para  1   Term  0   Preterm  1   AB  1   Living  1     SAB  0   IAB  0   Ectopic  0   Multiple  0   Live Births               Home Medications    Prior to Admission medications   Medication Sig Start Date End Date Taking? Authorizing Provider  cyclobenzaprine (FLEXERIL) 10 MG tablet 1/2 to 1 bid after work and bed time for muscle spasm 12/11/20  Yes Rodriguez-Southworth, Nettie Elm, PA-C  Multiple Vitamin (MULTI-VITAMIN) tablet Take by mouth.   Yes [provider]    Family History Family History  Problem Relation Age of Onset  . Healthy Mother   . Heart failure Father   . Heart disease Father   . Cancer Maternal Grandmother     Social History Social History   Tobacco Use  . Smoking status: Never Smoker  . Smokeless tobacco: Never Used  Vaping Use  . Vaping Use: Never used  Substance Use Topics  . Alcohol use: Yes    Comment: occassional  . Drug use: No     Allergies   Patient has no known allergies.   Review of Systems Review of Systems  Constitutional: Negative for fever.  Gastrointestinal: Negative for abdominal pain.  Genitourinary: Negative for difficulty urinating, dysuria, flank pain, frequency, hematuria and urgency.  Skin:  Negative for color change and rash.     Physical Exam Triage Vital Signs ED Triage Vitals  Enc Vitals Group     BP 12/11/20 1747 136/82     Pulse Rate 12/11/20 1747 60     Resp 12/11/20 1747 18     Temp 12/11/20 1747 98 F (36.7 C)     Temp Source 12/11/20 1747 Oral     SpO2 12/11/20 1747 100 %     Weight 12/11/20 1745 162 lb 14.7 oz (73.9 kg)     Height 12/11/20 1745 5\' 4"  (1.626 m)     Head Circumference --      Peak Flow --      Pain Score 12/11/20 1745 6     Pain Loc --      Pain Edu? --      Excl. in GC? --    No data found.  Updated Vital Signs BP 136/82 (BP Location: Left Arm)   Pulse 60   Temp 98 F (36.7 C) (Oral)   Resp 18   Ht 5\' 4"  (1.626 m)   Wt 162 lb 14.7 oz (73.9  kg)   SpO2 100%   BMI 27.97 kg/m   Visual Acuity Right Eye Distance:   Left Eye Distance:   Bilateral Distance:    Right Eye Near:   Left Eye Near:    Bilateral Near:     Physical Exam Vitals and nursing note reviewed.  Constitutional:      General: She is not in acute distress.    Appearance: She is not toxic-appearing.     Comments: Has pain when she moves around  HENT:     Head: Normocephalic.     Right Ear: External ear normal.     Left Ear: External ear normal.  Eyes:     General: No scleral icterus.    Conjunctiva/sclera: Conjunctivae normal.  Pulmonary:     Effort: Pulmonary effort is normal.  Abdominal:     Palpations: Abdomen is soft.     Tenderness: There is no abdominal tenderness.  Musculoskeletal:        General: Normal range of motion.     Cervical back: Neck supple.     Comments: BACK- has local tenderness on L mid lumbar muscle region which is provoked with lateral flexion, anterior flexion > 45 degrees, and posterior flexion > 15 degrees. Neg SLR. Also with L thoracic rotation, but none to the R.  Skin:    General: Skin is warm and dry.     Findings: No bruising, erythema or rash.  Neurological:     Mental Status: She is alert and oriented to person,  place, and time.     Gait: Gait normal.  Psychiatric:        Mood and Affect: Mood normal.        Behavior: Behavior normal.        Thought Content: Thought content normal.        Judgment: Judgment normal.      UC Treatments / Results  Labs (all labs ordered are listed, but only abnormal results are displayed) Labs Reviewed  URINALYSIS, COMPLETE (UACMP) WITH MICROSCOPIC    EKG   Radiology No results found.  Procedures Procedures (including critical care time)  Medications Ordered in UC Medications - No data to display  Initial Impression / Assessment and Plan / UC Course  I have reviewed the triage vital signs and the nursing notes. L lumbar strain. I placed her on Flexeril as noted. May take Ibuprofen prn pain. Needs to Fu with PCP next week.  Pertinent labs results that were available during my care of the patient were reviewed by me and considered in my medical decision making (see chart for details). See instructions Final Clinical Impressions(s) / UC Diagnoses   Final diagnoses:  Strain of lumbar region, initial encounter     Discharge Instructions     You may take Ibuprofen up to 600-800 mg every 8 hours for pain and inflammation  Use heat for 20 minutes 3-4 times a day and do stretches after the heat    ED Prescriptions    Medication Sig Dispense Auth. Provider   cyclobenzaprine (FLEXERIL) 10 MG tablet 1/2 to 1 bid after work and bed time for muscle spasm 20 tablet Rodriguez-Southworth, Nettie Elm, PA-C     PDMP not reviewed this encounter.   Garey Ham, [place] 12/11/20 [id number]

## 2020-12-11 NOTE — ED Triage Notes (Signed)
Patient c/o low back pain that started a few days ago. She denies urinary symptoms but is concerned for a UTI.

## 2020-12-11 NOTE — Discharge Instructions (Addendum)
You may take Ibuprofen up to 600-800 mg every 8 hours for pain and inflammation  Use heat for 20 minutes 3-4 times a day and do stretches after the heat

## 2021-02-11 ENCOUNTER — Ambulatory Visit
Admission: EM | Admit: 2021-02-11 | Discharge: 2021-02-11 | Disposition: A | Discharge status: Home / Self Care | Payer: BC Managed Care – PPO | Attending: Sports Medicine | Admitting: Sports Medicine

## 2021-02-11 ENCOUNTER — Other Ambulatory Visit: Payer: Self-pay

## 2021-02-11 DIAGNOSIS — R350 Frequency of micturition: Secondary | ICD-10-CM | POA: Diagnosis present

## 2021-02-11 DIAGNOSIS — R319 Hematuria, unspecified: Secondary | ICD-10-CM | POA: Insufficient documentation

## 2021-02-11 DIAGNOSIS — N390 Urinary tract infection, site not specified: Secondary | ICD-10-CM | POA: Insufficient documentation

## 2021-02-11 DIAGNOSIS — R300 Dysuria: Secondary | ICD-10-CM | POA: Insufficient documentation

## 2021-02-11 LAB — URINALYSIS, COMPLETE (UACMP) WITH MICROSCOPIC
Bilirubin Urine: NEGATIVE
Glucose, UA: 100 mg/dL — AB
Ketones, ur: NEGATIVE mg/dL
Nitrite: POSITIVE — AB
Protein, ur: NEGATIVE mg/dL
Specific Gravity, Urine: 1.015 (ref 1.005–1.030)
WBC, UA: >50 WBC/hpf (ref 0–5)
pH: 7.5 (ref 5.0–8.0)

## 2021-02-11 MED ORDER — NITROFURANTOIN MONOHYD MACRO 100 MG PO CAPS: 100.0000 mg | ORAL_CAPSULE | Freq: Two times a day (BID) | ORAL | 0 refills | Status: DC

## 2021-02-11 MED ORDER — PHENAZOPYRIDINE HCL 200 MG PO TABS: 200.0000 mg | ORAL_TABLET | Freq: Three times a day (TID) | ORAL | 0 refills | Status: DC

## 2021-02-11 NOTE — ED Provider Notes (Signed)
[facility]    CSN: [id number] Arrival date & time: 02/11/21  0808      History   Chief Complaint Chief Complaint  Patient presents with  . Dysuria    HPI Meagan Guzman is a 45 y.o. female.   45 year old female who presents for evaluation of the above issue.  Her normal primary care needs are met with the group here in [place], [place].  She works in Therapist, art at [employer].  She said she started having symptoms about 4 days ago.  This consists of dysuria, some hematuria, increased frequency, increased urgency, and just a small amount of urine voiding.  Her last menstrual period is unknown as she said that she has had an endometrial ablation and she gets some spotting at times.  That said, she is sexually active and she reports that she is not pregnant.  She also reports that she has no vaginal discharges and has no concern about an STD.  She has some mild suprapubic discomfort but no flank pain or overt abdominal pain.  No fever shakes chills.  No nausea vomiting or diarrhea.  No chest pain or shortness of breath.  No red flag signs or symptoms are elicited on history.     Past Medical History:  Diagnosis Date  . UTI (lower urinary tract infection)     There are no problems to display for this patient.   Past Surgical History:  Procedure Laterality Date  . ABLATION    . APPENDECTOMY      OB History    Gravida  2   Para  1   Term  0   Preterm  1   AB  1   Living  1     SAB  0   IAB  0   Ectopic  0   Multiple  0   Live Births               Home Medications    Prior to Admission medications   Medication Sig Start Date End Date Taking? Authorizing Provider  Multiple Vitamin (MULTI-VITAMIN) tablet Take by mouth.   Yes [provider]  nitrofurantoin, macrocrystal-monohydrate, (MACROBID) 100 MG capsule Take 1 capsule (100 mg total) by mouth 2 (two) times daily. 02/11/21  Yes Verda Cumins, MD  phenazopyridine (PYRIDIUM) 200 MG  tablet Take 1 tablet (200 mg total) by mouth 3 (three) times daily. 02/11/21  Yes Verda Cumins, MD  cyclobenzaprine (FLEXERIL) 10 MG tablet 1/2 to 1 bid after work and bed time for muscle spasm 12/11/20   Rodriguez-Southworth, Sunday Spillers, PA-C    Family History Family History  Problem Relation Age of Onset  . Healthy Mother   . Heart failure Father   . Heart disease Father   . Cancer Maternal Grandmother     Social History Social History   Tobacco Use  . Smoking status: Never Smoker  . Smokeless tobacco: Never Used  Vaping Use  . Vaping Use: Never used  Substance Use Topics  . Alcohol use: Yes    Comment: occassional  . Drug use: No     Allergies   Patient has no known allergies.   Review of Systems Review of Systems  Constitutional: Negative.  Negative for activity change, appetite change, chills, diaphoresis and fever.  HENT: Negative.  Negative for congestion and sinus pressure.   Eyes: Negative.  Negative for pain.  Respiratory: Negative.  Negative for cough and shortness of breath.   Cardiovascular: Negative.  Negative  for chest pain and palpitations.  Gastrointestinal: Positive for abdominal pain. Negative for diarrhea, nausea and vomiting.  Genitourinary: Positive for dysuria, frequency, hematuria and urgency. Negative for flank pain, pelvic pain, vaginal bleeding, vaginal discharge and vaginal pain.  Musculoskeletal: Negative.  Negative for arthralgias, back pain, myalgias, neck pain and neck stiffness.  Skin: Negative.  Negative for color change, pallor, rash and wound.  Neurological: Negative for dizziness, syncope, numbness and headaches.  All other systems reviewed and are negative.    Physical Exam Triage Vital Signs ED Triage Vitals  Enc Vitals Group     BP 02/11/21 0826 111/70     Pulse Rate 02/11/21 0826 (!) 56     Resp 02/11/21 0826 18     Temp 02/11/21 0826 97.8 F (36.6 C)     Temp Source 02/11/21 0826 Oral     SpO2 02/11/21 0826 100 %      Weight 02/11/21 0824 163 lb (73.9 kg)     Height 02/11/21 0824 '5\' 4"'  (1.626 m)     Head Circumference --      Peak Flow --      Pain Score 02/11/21 0824 9     Pain Loc --      Pain Edu? --      Excl. in [place]? --    No data found.  Updated Vital Signs BP 111/70 (BP Location: Left Arm)   Pulse (!) 56   Temp 97.8 F (36.6 C) (Oral)   Resp 18   Ht '5\' 4"'  (1.626 m)   Wt 73.9 kg   SpO2 100%   BMI 27.98 kg/m   Visual Acuity Right Eye Distance:   Left Eye Distance:   Bilateral Distance:    Right Eye Near:   Left Eye Near:    Bilateral Near:     Physical Exam Vitals and nursing note reviewed.  Constitutional:      General: She is not in acute distress.    Appearance: Normal appearance. She is not ill-appearing, toxic-appearing or diaphoretic.  HENT:     Head: Normocephalic and atraumatic.  Eyes:     General: No scleral icterus.       Right eye: No discharge.        Left eye: No discharge.     Extraocular Movements: Extraocular movements intact.     Conjunctiva/sclera: Conjunctivae normal.     Pupils: Pupils are equal, round, and reactive to light.  Cardiovascular:     Rate and Rhythm: Normal rate and regular rhythm.     Pulses: Normal pulses.     Heart sounds: Normal heart sounds. No murmur heard. No friction rub. No gallop.   Pulmonary:     Effort: Pulmonary effort is normal. No respiratory distress.     Breath sounds: Normal breath sounds. No stridor. No wheezing, rhonchi or rales.  Abdominal:     General: Abdomen is flat. Bowel sounds are normal. There is no distension.     Palpations: Abdomen is soft. There is no mass.     Tenderness: There is abdominal tenderness in the suprapubic area. There is no right CVA tenderness, left CVA tenderness, guarding or rebound.     Hernia: No hernia is present.     Comments: Very mild suprapubic tendersness  Musculoskeletal:     Cervical back: Normal range of motion and neck supple. No rigidity or tenderness.  Lymphadenopathy:      Cervical: No cervical adenopathy.  Skin:    General: Skin is warm.  Capillary Refill: Capillary refill takes less than 2 seconds.     Findings: No bruising, erythema, lesion or rash.  Neurological:     General: No focal deficit present.     Mental Status: She is alert and oriented to person, place, and time.      UC Treatments / Results  Labs (all labs ordered are listed, but only abnormal results are displayed) Labs Reviewed  URINALYSIS, COMPLETE (UACMP) WITH MICROSCOPIC - Abnormal; Notable for the following components:      Result Value   APPearance HAZY (*)    Glucose, UA 100 (*)    Hgb urine dipstick MODERATE (*)    Nitrite POSITIVE (*)    Leukocytes,Ua SMALL (*)    Bacteria, UA FEW (*)    All other components within normal limits  URINE CULTURE    EKG   Radiology No results found.  Procedures Procedures (including critical care time)  Medications Ordered in UC Medications - No data to display  Initial Impression / Assessment and Plan / UC Course  I have reviewed the triage vital signs and the nursing notes.  Pertinent labs & imaging results that were available during my care of the patient were reviewed by me and considered in my medical decision making (see chart for details).  Clinical impression: 45 year old with 4 days of urinary symptoms.  Concerning for UTI.  Treatment plan: 1.  The findings and treatment plan were discussed in detail with the patient.  Patient was in agreement. 2.  We will check a UA.  Urine was hazy with moderate blood, positive nitrites, small amount of leukocytes, greater than 50 WBCs, and bacteria.  There was also small amount of glucose.  Patient denies any history of diabetes.  UA is consistent with a UTI.  We will send off the culture. 3.  We will treat with Macrobid and Pyridium. 4.  Educational handouts provided. 5.  Had a long discussion with the patient regarding multiple UTIs on a regular basis.  She needs to see  her primary care provider and discuss this further.  Given her symptoms and the frequency I would recommend urology referral. 6.  Plenty of rest, plenty fluids, Tylenol or Motrin for any discomfort. 7.  Discussed the fact that if she was to develop flank pain, fever, worsening abdominal pain, or nausea, this could be indication of an ascending infection and early pyelonephritis.  I advised her that she needed to go to the ER if she had any of these issues. 8.  If the culture shows that Macrobid is not the appropriate antibiotic and it is resistant and someone will call her have her stop that medicine and call in another medicine.  I conveyed that to the patient.  She voiced verbal understanding. 9.  Follow-up here as needed.  Patient stable on discharge.    Final Clinical Impressions(s) / UC Diagnoses   Final diagnoses:  Lower urinary tract infectious disease  Dysuria  Urinary frequency  Hematuria, unspecified type     Discharge Instructions     Your urine shows that you do have a UTI.  I sent off the culture. I am getting give you an antibiotic.  If the culture shows that the one I chose is not sensitive to that antibiotic someone may call you and change the antibiotic. In the meantime continue to flush her system with plenty of fluids.  Its okay to use the Azo.  I prescribed the antibiotic and something for the burning called  Pyridium. Educational handouts provided. As we discussed, please get in with your primary care provider and let them know that you are having multiple UTIs in a year and they can refer you to urology to ensure that there is not a fixable issue that would alleviate all of these UTIs on a regular basis. If your symptoms were to worsen you develop fever, flank pain, significant abdominal pain, vomiting, then please go to the ER.    ED Prescriptions    Medication Sig Dispense Auth. Provider   phenazopyridine (PYRIDIUM) 200 MG tablet Take 1 tablet (200 mg total) by  mouth 3 (three) times daily. 6 tablet Verda Cumins, MD   nitrofurantoin, macrocrystal-monohydrate, (MACROBID) 100 MG capsule Take 1 capsule (100 mg total) by mouth 2 (two) times daily. 10 capsule Verda Cumins, MD     PDMP not reviewed this encounter.   Verda Cumins, MD 02/11/21 [phone]

## 2021-02-11 NOTE — Discharge Instructions (Addendum)
Your urine shows that you do have a UTI.  I sent off the culture. I am getting give you an antibiotic.  If the culture shows that the one I chose is not sensitive to that antibiotic someone may call you and change the antibiotic. In the meantime continue to flush her system with plenty of fluids.  Its okay to use the Azo.  I prescribed the antibiotic and something for the burning called Pyridium. Educational handouts provided. As we discussed, please get in with your primary care provider and let them know that you are having multiple UTIs in a year and they can refer you to urology to ensure that there is not a fixable issue that would alleviate all of these UTIs on a regular basis. If your symptoms were to worsen you develop fever, flank pain, significant abdominal pain, vomiting, then please go to the ER.

## 2021-02-11 NOTE — ED Triage Notes (Signed)
Pt c/o possible UTI, urinary urgency, possible hematuria, dysuria since Sunday. Pt denies f/n/v/d or other symptoms. Pt has been taking Azo, this helped some.

## 2021-02-12 LAB — URINE CULTURE: Culture: <10000 — AB

## 2021-05-10 ENCOUNTER — Ambulatory Visit
Admission: EM | Admit: 2021-05-10 | Discharge: 2021-05-10 | Disposition: A | Discharge status: Home / Self Care | Payer: BC Managed Care – PPO | Attending: Physician Assistant | Admitting: Physician Assistant

## 2021-05-10 ENCOUNTER — Other Ambulatory Visit: Payer: Self-pay

## 2021-05-10 DIAGNOSIS — R300 Dysuria: Secondary | ICD-10-CM | POA: Diagnosis present

## 2021-05-10 DIAGNOSIS — N3001 Acute cystitis with hematuria: Secondary | ICD-10-CM | POA: Insufficient documentation

## 2021-05-10 LAB — POCT URINALYSIS DIP (DEVICE)
Bilirubin Urine: NEGATIVE
Glucose, UA: NEGATIVE mg/dL
Ketones, ur: NEGATIVE mg/dL
Nitrite: POSITIVE — AB
Protein, ur: 30 mg/dL — AB
Specific Gravity, Urine: <=1.005 (ref 1.005–1.030)
Urobilinogen, UA: 0.2 mg/dL (ref 0.0–1.0)
pH: 6 (ref 5.0–8.0)

## 2021-05-10 MED ORDER — NITROFURANTOIN MONOHYD MACRO 100 MG PO CAPS: 100.0000 mg | ORAL_CAPSULE | Freq: Two times a day (BID) | ORAL | 0 refills | Status: AC

## 2021-05-10 MED ORDER — PHENAZOPYRIDINE HCL 200 MG PO TABS: 200.0000 mg | ORAL_TABLET | Freq: Three times a day (TID) | ORAL | 0 refills | Status: DC

## 2021-05-10 NOTE — Discharge Instructions (Addendum)

## 2021-05-10 NOTE — ED Triage Notes (Signed)
Pt states Friday Morning she woke up with SX of a UTI was taking AZO without relief, the dysuria is worst today, burning, mild pelvic pain, denies back pain.

## 2021-05-10 NOTE — ED Provider Notes (Signed)
[facility]    CSN: [id number] Arrival date & time: 05/10/21  4010      History   Chief Complaint Chief Complaint  Patient presents with   Dysuria    HPI Meagan Guzman is a 45 y.o. female presenting for 2-day history of dysuria, urinary frequency and urgency as well as pelvic discomfort.  Patient denies any fever, chills, fatigue, back pain or hematuria.  No vaginal discharge or concern for STIs.  Patient reports history of frequent UTIs and was last treated for UTI about 2-1/2 months ago.  Patient says she knows that she needs to see a urologist but has not seen one yet.  She has been taking over-the-counter AZO without any improvement in her symptoms.  She has no other complaints or concerns.  HPI  Past Medical History:  Diagnosis Date   UTI (lower urinary tract infection)     There are no problems to display for this patient.   Past Surgical History:  Procedure Laterality Date   ABLATION     APPENDECTOMY      OB History     Gravida  2   Para  1   Term  0   Preterm  1   AB  1   Living  1      SAB  0   IAB  0   Ectopic  0   Multiple  0   Live Births               Home Medications    Prior to Admission medications   Medication Sig Start Date End Date Taking? Authorizing Provider  nitrofurantoin, macrocrystal-monohydrate, (MACROBID) 100 MG capsule Take 1 capsule (100 mg total) by mouth 2 (two) times daily for 5 days. 05/10/21 05/15/21 Yes Shirlee Latch, PA-C  phenazopyridine (PYRIDIUM) 200 MG tablet Take 1 tablet (200 mg total) by mouth 3 (three) times daily. 05/10/21  Yes Shirlee Latch, PA-C  cyclobenzaprine (FLEXERIL) 10 MG tablet 1/2 to 1 bid after work and bed time for muscle spasm 12/11/20   Rodriguez-Southworth, Nettie Elm, PA-C  Multiple Vitamin (MULTI-VITAMIN) tablet Take by mouth.    [provider]    Family History Family History  Problem Relation Age of Onset   Healthy Mother    Heart failure Father    Heart  disease Father    Cancer Maternal Grandmother     Social History Social History   Tobacco Use   Smoking status: Never   Smokeless tobacco: Never  Vaping Use   Vaping Use: Never used  Substance Use Topics   Alcohol use: Yes    Comment: occassional   Drug use: No     Allergies   Patient has no known allergies.   Review of Systems Review of Systems  Constitutional:  Negative for chills and fever.  Gastrointestinal:  Positive for abdominal pain. Negative for diarrhea, nausea and vomiting.  Genitourinary:  Positive for dysuria, frequency and urgency. Negative for decreased urine volume, flank pain, hematuria, pelvic pain, vaginal bleeding, vaginal discharge and vaginal pain.  Musculoskeletal:  Negative for back pain.  Skin:  Negative for rash.    Physical Exam Triage Vital Signs ED Triage Vitals  Enc Vitals Group     BP 05/10/21 0826 134/72     Pulse Rate 05/10/21 0826 (!) 59     Resp 05/10/21 0826 16     Temp 05/10/21 0826 97.8 F (36.6 C)     Temp Source 05/10/21 0826 Oral  SpO2 05/10/21 0826 100 %     Weight 05/10/21 0823 164 lb (74.4 kg)     Height 05/10/21 0823 5\' 4"  (1.626 m)     Head Circumference --      Peak Flow --      Pain Score 05/10/21 0823 9     Pain Loc --      Pain Edu? --      Excl. in [place]? --    No data found.  Updated Vital Signs BP 134/72 (BP Location: Right Arm)   Pulse (!) 59   Temp 97.8 F (36.6 C) (Oral)   Resp 16   Ht 5\' 4"  (1.626 m)   Wt 164 lb (74.4 kg)   SpO2 100%   BMI 28.15 kg/m      Physical Exam Vitals and nursing note reviewed.  Constitutional:      General: She is not in acute distress.    Appearance: Normal appearance. She is not ill-appearing or toxic-appearing.  HENT:     Head: Normocephalic and atraumatic.  Eyes:     General: No scleral icterus.       Right eye: No discharge.        Left eye: No discharge.     Conjunctiva/sclera: Conjunctivae normal.  Cardiovascular:     Rate and Rhythm: Normal rate  and regular rhythm.     Heart sounds: Normal heart sounds.  Pulmonary:     Effort: Pulmonary effort is normal. No respiratory distress.     Breath sounds: Normal breath sounds.  Abdominal:     Palpations: Abdomen is soft.     Tenderness: There is no abdominal tenderness. There is no right CVA tenderness or left CVA tenderness.  Musculoskeletal:     Cervical back: Neck supple.  Skin:    General: Skin is dry.  Neurological:     General: No focal deficit present.     Mental Status: She is alert. Mental status is at baseline.     Motor: No weakness.     Gait: Gait normal.  Psychiatric:        Mood and Affect: Mood normal.        Behavior: Behavior normal.        Thought Content: Thought content normal.     UC Treatments / Results  Labs (all labs ordered are listed, but only abnormal results are displayed) Labs Reviewed  POCT URINALYSIS DIP (DEVICE) - Abnormal; Notable for the following components:      Result Value   Hgb urine dipstick LARGE (*)    Protein, ur 30 (*)    Nitrite POSITIVE (*)    Leukocytes,Ua LARGE (*)    All other components within normal limits  URINE CULTURE  POCT URINALYSIS DIPSTICK, ED / UC    EKG   Radiology No results found.  Procedures Procedures (including critical care time)  Medications Ordered in UC Medications - No data to display  Initial Impression / Assessment and Plan / UC Course  I have reviewed the triage vital signs and the nursing notes.  Pertinent labs & imaging results that were available during my care of the patient were reviewed by me and considered in my medical decision making (see chart for details).  45 year old female presenting for 2-day history of dysuria, urinary frequency urgency as well as pelvic cramping.  Vital signs are normal and stable and she is overall well-appearing.  Exam benign.  GU exam deferred.  Urinalysis performed today shows large blood, positive  nitrites and large of the sites as well as protein.   We will send for culture and treat for UTI.  Advised patient I would like to do something different than Macrobid since she did have that a couple of months ago.  Patient says that she has the most improvement in her symptoms with that antibiotic and she would prefer to take that 1 over others.  Advised patient I will go ahead and send Macrobid but we may need to call and change the antibiotic if the culture does not show any sensitivity to it.  Patient is agreeable to plan.  Work note given for today.   Final Clinical Impressions(s) / UC Diagnoses   Final diagnoses:  Acute cystitis with hematuria  Dysuria     Discharge Instructions      UTI: Based on either symptoms or urinalysis, you may have a urinary tract infection. We will send the urine for culture and call with results in a few days. Begin antibiotics at this time. Your symptoms should be much improved over the next 2-3 days. Increase rest and fluid intake. If for some reason symptoms are worsening or not improving after a couple of days or the urine culture determines the antibiotics you are taking will not treat the infection, the antibiotics may be changed. Return or go to ER for fever, back pain, worsening urinary pain, discharge, increased blood in urine. May take Tylenol or Motrin OTC for pain relief or consider AZO if no contraindications      ED Prescriptions     Medication Sig Dispense Auth. Provider   nitrofurantoin, macrocrystal-monohydrate, (MACROBID) 100 MG capsule Take 1 capsule (100 mg total) by mouth 2 (two) times daily for 5 days. 10 capsule Eusebio Friendly B, PA-C   phenazopyridine (PYRIDIUM) 200 MG tablet Take 1 tablet (200 mg total) by mouth 3 (three) times daily. 6 tablet Gareth Morgan      PDMP not reviewed this encounter.   Shirlee Latch, PA-C 05/10/21 [phone]

## 2021-05-11 LAB — URINE CULTURE: Culture: NO GROWTH

## 2021-11-09 ENCOUNTER — Ambulatory Visit
Admission: EM | Admit: 2021-11-09 | Discharge: 2021-11-09 | Disposition: A | Discharge status: Home / Self Care | Payer: BC Managed Care – PPO | Attending: Emergency Medicine | Admitting: Emergency Medicine

## 2021-11-09 ENCOUNTER — Other Ambulatory Visit: Payer: Self-pay

## 2021-11-09 DIAGNOSIS — N390 Urinary tract infection, site not specified: Secondary | ICD-10-CM | POA: Diagnosis not present

## 2021-11-09 LAB — URINALYSIS, ROUTINE W REFLEX MICROSCOPIC
Bilirubin Urine: NEGATIVE
Glucose, UA: 100 mg/dL — AB
Hgb urine dipstick: NEGATIVE
Ketones, ur: NEGATIVE mg/dL
Leukocytes,Ua: NEGATIVE
Nitrite: POSITIVE — AB
Protein, ur: NEGATIVE mg/dL
Specific Gravity, Urine: <1.005 — ABNORMAL LOW (ref 1.005–1.030)
pH: 6 (ref 5.0–8.0)

## 2021-11-09 LAB — URINALYSIS, MICROSCOPIC (REFLEX)

## 2021-11-09 MED ORDER — NITROFURANTOIN MONOHYD MACRO 100 MG PO CAPS: 100.0000 mg | ORAL_CAPSULE | Freq: Two times a day (BID) | ORAL | 0 refills | Status: DC

## 2021-11-09 MED ORDER — PHENAZOPYRIDINE HCL 200 MG PO TABS: 200.0000 mg | ORAL_TABLET | Freq: Three times a day (TID) | ORAL | 0 refills | Status: DC

## 2021-11-09 NOTE — Discharge Instructions (Addendum)

## 2021-11-09 NOTE — ED Provider Notes (Signed)
[facility]    CSN: [id number] Arrival date & time: 11/09/21  1051      History   Chief Complaint Chief Complaint  Patient presents with   Dysuria    HPI Nayellie Haseley is a 46 y.o. female.   HPI  46 year old female here for evaluation of urinary complaints.  Patient reports that last night she developed urgency, dysuria, and bladder pressure.  She also reports that she saw some cloudiness to her urine.  She denies any fever, blood in her urine, low back pain, or suprapubic pain.  She does have a frequent history of UTIs and states that as of late they tend to come about if she does not consume an adequate amount of water.  She reports that this past weekend she was out of town with some girlfriends and drink less water and consume more alcohol.  She is unsure if this may have been a.  She denies any recent menstrual cycles, hot tub use, or intercourse.  Past Medical History:  Diagnosis Date   UTI (lower urinary tract infection)     There are no problems to display for this patient.   Past Surgical History:  Procedure Laterality Date   ABLATION     APPENDECTOMY      OB History     Gravida  2   Para  1   Term  0   Preterm  1   AB  1   Living  1      SAB  0   IAB  0   Ectopic  0   Multiple  0   Live Births               Home Medications    Prior to Admission medications   Medication Sig Start Date End Date Taking? Authorizing Provider  Multiple Vitamin (MULTI-VITAMIN) tablet Take by mouth.   Yes [provider]  nitrofurantoin, macrocrystal-monohydrate, (MACROBID) 100 MG capsule Take 1 capsule (100 mg total) by mouth 2 (two) times daily. 11/09/21  Yes Margarette Canada, NP  phenazopyridine (PYRIDIUM) 200 MG tablet Take 1 tablet (200 mg total) by mouth 3 (three) times daily. 11/09/21  Yes Margarette Canada, NP    Family History Family History  Problem Relation Age of Onset   Healthy Mother    Heart failure Father    Heart disease  Father    Cancer Maternal Grandmother     Social History Social History   Tobacco Use   Smoking status: Never   Smokeless tobacco: Never  Vaping Use   Vaping Use: Never used  Substance Use Topics   Alcohol use: Yes    Comment: occassional   Drug use: No     Allergies   Patient has no known allergies.   Review of Systems Review of Systems  Gastrointestinal:  Negative for abdominal pain.  Genitourinary:  Positive for dysuria, frequency and urgency. Negative for hematuria.  Musculoskeletal:  Negative for back pain.  Hematological: Negative.   Psychiatric/Behavioral: Negative.      Physical Exam Triage Vital Signs ED Triage Vitals  Enc Vitals Group     BP 11/09/21 1127 107/68     Pulse Rate 11/09/21 1127 (!) 52     Resp 11/09/21 1127 18     Temp 11/09/21 1127 99 F (37.2 C)     Temp Source 11/09/21 1127 Oral     SpO2 11/09/21 1127 98 %     Weight 11/09/21 1126 164 lb (74.4  kg)     Height 11/09/21 1126 5\' 4"  (1.626 m)     Head Circumference --      Peak Flow --      Pain Score 11/09/21 1125 0     Pain Loc --      Pain Edu? --      Excl. in [place]? --    No data found.  Updated Vital Signs BP 107/68 (BP Location: Right Arm)    Pulse (!) 52    Temp 99 F (37.2 C) (Oral)    Resp 18    Ht 5\' 4"  (1.626 m)    Wt 164 lb (74.4 kg)    SpO2 98%    BMI 28.15 kg/m   Visual Acuity Right Eye Distance:   Left Eye Distance:   Bilateral Distance:    Right Eye Near:   Left Eye Near:    Bilateral Near:     Physical Exam Vitals and nursing note reviewed.  Constitutional:      General: She is not in acute distress.    Appearance: Normal appearance. She is not ill-appearing.  HENT:     Head: Normocephalic and atraumatic.  Cardiovascular:     Rate and Rhythm: Normal rate and regular rhythm.     Pulses: Normal pulses.     Heart sounds: Normal heart sounds. No murmur heard.   No friction rub. No gallop.  Pulmonary:     Effort: Pulmonary effort is normal.     Breath  sounds: Normal breath sounds. No wheezing, rhonchi or rales.  Abdominal:     Tenderness: There is no right CVA tenderness or left CVA tenderness.  Skin:    General: Skin is warm and dry.     Capillary Refill: Capillary refill takes less than 2 seconds.     Findings: No erythema or rash.  Neurological:     General: No focal deficit present.     Mental Status: She is alert and oriented to person, place, and time.  Psychiatric:        Mood and Affect: Mood normal.        Behavior: Behavior normal.        Thought Content: Thought content normal.        Judgment: Judgment normal.     UC Treatments / Results  Labs (all labs ordered are listed, but only abnormal results are displayed) Labs Reviewed  URINALYSIS, ROUTINE W REFLEX MICROSCOPIC - Abnormal; Notable for the following components:      Result Value   Color, Urine ORANGE (*)    Specific Gravity, Urine <1.005 (*)    Glucose, UA 100 (*)    Nitrite POSITIVE (*)    All other components within normal limits  URINALYSIS, MICROSCOPIC (REFLEX) - Abnormal; Notable for the following components:   Bacteria, UA FEW (*)    All other components within normal limits  URINE CULTURE    EKG   Radiology No results found.  Procedures Procedures (including critical care time)  Medications Ordered in UC Medications - No data to display  Initial Impression / Assessment and Plan / UC Course  I have reviewed the triage vital signs and the nursing notes.  Pertinent labs & imaging results that were available during my care of the patient were reviewed by me and considered in my medical decision making (see chart for details).  Patient is a nontoxic-appearing 46 year old female here for evaluation of urinary complaints as outlined HPI above.  Her physical exam reveals  benign cardiopulmonary exam with clear lung sounds in all fields.  CVA tenderness is not present on either side.  Abdomen is soft and nontender.  Urinalysis was collected at  triage and is pending.  Urinalysis shows an orange appearance with a low specific gravity, 100 glucose, and nitrate positive.  Negative for ketones, protein, or leukocyte esterase.  Micro shows few bacteria with 0-5 WBCs and 0-5 squamous epithelials.  Urine culture pending.  Given patient's symptoms and presence of nitrates in her urine I will treat her for presumptive UTI while the culture is pending.  We will also give Pyridium to help with urinary discomfort.   Final Clinical Impressions(s) / UC Diagnoses   Final diagnoses:  Lower urinary tract infectious disease     Discharge Instructions      Take the Macrobid twice daily for 5 days with food for treatment of urinary tract infection.  Use the Pyridium every 8 hours as needed for urinary discomfort.  This will turn your urine a bright red-orange.  Increase your oral fluid intake so that you increase your urine production and or flushing your urinary system.  Take an over-the-counter probiotic, such as Culturelle-Align-Activia, 1 hour after each dose of antibiotic to prevent diarrhea or yeast infections from forming.  We will culture urine and change the antibiotics if necessary.  Return for reevaluation, or see your primary care provider, for any new or worsening symptoms.      ED Prescriptions     Medication Sig Dispense Auth. Provider   nitrofurantoin, macrocrystal-monohydrate, (MACROBID) 100 MG capsule Take 1 capsule (100 mg total) by mouth 2 (two) times daily. 10 capsule Margarette Canada, NP   phenazopyridine (PYRIDIUM) 200 MG tablet Take 1 tablet (200 mg total) by mouth 3 (three) times daily. 6 tablet Margarette Canada, NP      PDMP not reviewed this encounter.   Margarette Canada, NP 11/09/21 1210

## 2021-11-09 NOTE — ED Triage Notes (Signed)
Pt here with C/o urgency, pain, pressure with urination since yesterday.

## 2021-11-12 LAB — URINE CULTURE: Culture: 20000 — AB

## 2021-12-29 ENCOUNTER — Ambulatory Visit
Admission: EM | Admit: 2021-12-29 | Discharge: 2021-12-29 | Disposition: A | Discharge status: Home / Self Care | Payer: BC Managed Care – PPO | Attending: Internal Medicine | Admitting: Internal Medicine

## 2021-12-29 ENCOUNTER — Other Ambulatory Visit: Payer: Self-pay

## 2021-12-29 DIAGNOSIS — N3001 Acute cystitis with hematuria: Secondary | ICD-10-CM | POA: Insufficient documentation

## 2021-12-29 DIAGNOSIS — R300 Dysuria: Secondary | ICD-10-CM | POA: Insufficient documentation

## 2021-12-29 LAB — URINALYSIS, ROUTINE W REFLEX MICROSCOPIC
Bilirubin Urine: NEGATIVE
Glucose, UA: 100 mg/dL — AB
Ketones, ur: NEGATIVE mg/dL
Nitrite: POSITIVE — AB
Protein, ur: NEGATIVE mg/dL
Specific Gravity, Urine: 1.01 (ref 1.005–1.030)
pH: 6.5 (ref 5.0–8.0)

## 2021-12-29 LAB — URINALYSIS, MICROSCOPIC (REFLEX): WBC, UA: >50 WBC/hpf (ref 0–5)

## 2021-12-29 MED ORDER — PHENAZOPYRIDINE HCL 200 MG PO TABS: 200.0000 mg | ORAL_TABLET | Freq: Three times a day (TID) | ORAL | 0 refills | Status: DC

## 2021-12-29 MED ORDER — NITROFURANTOIN MONOHYD MACRO 100 MG PO CAPS: 100.0000 mg | ORAL_CAPSULE | Freq: Two times a day (BID) | ORAL | 0 refills | Status: DC

## 2021-12-29 NOTE — Discharge Instructions (Signed)

## 2021-12-29 NOTE — ED Triage Notes (Signed)
Pt c/o urinary urgency, frequency, and burning after urination x2days. ? ?Pt is taking Azo.  ?

## 2021-12-29 NOTE — ED Provider Notes (Signed)
?[facility] ? ? ? ?CSN: [id number] ?Arrival date & time: 12/29/21  1123 ? ? ?  ? ?History   ?Chief Complaint ?Chief Complaint  ?Patient presents with  ? Urinary Tract Infection  ? ? ?HPI ?Meagan Guzman is a 46 y.o. female presenting for 2-day history of dysuria, Meagan Guzman frequency and urgency.  Patient reports multiple UTIs each year.  States she has had them more frequently since she had an ablation.  Patient also reports that she gets them after she drinks alcohol.  She says she drinks alcohol dehydrates her and that leads to a UTI.  She says she went out drinking with her friends over the weekend.  Patient denies any associated fever, chills, fatigue, abdominal or back pain.  No hematuria.  No reports of vaginal discharge.  Patient says she is not sexually active.  No concern for STIs reported.  Has tried over-the-counter AZO.  No other concerns. ? ?HPI ? ?Past Medical History:  ?Diagnosis Date  ? UTI (lower urinary tract infection)   ? ? ?There are no problems to display for this patient. ? ? ?Past Surgical History:  ?Procedure Laterality Date  ? ABLATION    ? APPENDECTOMY    ? ? ?OB History   ? ? Gravida  ?2  ? Para  ?1  ? Term  ?0  ? Preterm  ?1  ? AB  ?1  ? Living  ?1  ?  ? ? SAB  ?0  ? IAB  ?0  ? Ectopic  ?0  ? Multiple  ?0  ? Live Births  ?   ?   ?  ?  ? ? ? ?Home Medications   ? ?Prior to Admission medications   ?Medication Sig Start Date End Date Taking? Authorizing Provider  ?Multiple Vitamin (MULTI-VITAMIN) tablet Take by mouth.   Yes [provider]  ?nitrofurantoin, macrocrystal-monohydrate, (MACROBID) 100 MG capsule Take 1 capsule (100 mg total) by mouth 2 (two) times daily. 12/29/21   Shirlee Latch, PA-C  ?phenazopyridine (PYRIDIUM) 200 MG tablet Take 1 tablet (200 mg total) by mouth 3 (three) times daily. 12/29/21   Shirlee Latch, PA-C  ? ? ?Family History ?Family History  ?Problem Relation Age of Onset  ? Healthy Mother   ? Heart failure Father   ? Heart disease Father   ? Cancer  Maternal Grandmother   ? ? ?Social History ?Social History  ? ?Tobacco Use  ? Smoking status: Never  ? Smokeless tobacco: Never  ?Vaping Use  ? Vaping Use: Never used  ?Substance Use Topics  ? Alcohol use: Yes  ?  Comment: occassional  ? Drug use: No  ? ? ? ?Allergies   ?Patient has no known allergies. ? ? ?Review of Systems ?Review of Systems  ?Constitutional:  Negative for chills, fatigue and fever.  ?Gastrointestinal:  Negative for abdominal pain, diarrhea, nausea and vomiting.  ?Genitourinary:  Positive for dysuria, frequency and urgency. Negative for decreased urine volume, flank pain, hematuria, pelvic pain, vaginal bleeding, vaginal discharge and vaginal pain.  ?Musculoskeletal:  Negative for back pain.  ?Skin:  Negative for rash.  ? ? ?Physical Exam ?Triage Vital Signs ?ED Triage Vitals  ?Enc Vitals Group  ?   BP 12/29/21 1146 132/83  ?   Pulse Rate 12/29/21 1146 65  ?   Resp 12/29/21 1146 18  ?   Temp 12/29/21 1146 97.9 ?F (36.6 ?C)  ?   Temp Source 12/29/21 1146 Oral  ?  SpO2 12/29/21 1146 98 %  ?   Weight 12/29/21 1143 164 lb (74.4 kg)  ?   Height 12/29/21 1143 5\' 4"  (1.626 m)  ?   Head Circumference --   ?   Peak Flow --   ?   Pain Score 12/29/21 1143 6  ?   Pain Loc --   ?   Pain Edu? --   ?   Excl. in [place]? --   ? ?No data found. ? ?Updated Vital Signs ?BP 132/83 (BP Location: Left Arm)   Pulse 65   Temp 97.9 ?F (36.6 ?C) (Oral)   Resp 18   Ht 5\' 4"  (1.626 m)   Wt 164 lb (74.4 kg)   SpO2 98%   BMI 28.15 kg/m?  ?    ? ?Physical Exam ?Vitals and nursing note reviewed.  ?Constitutional:   ?   General: She is not in acute distress. ?   Appearance: Normal appearance. She is not ill-appearing or toxic-appearing.  ?HENT:  ?   Head: Normocephalic and atraumatic.  ?Eyes:  ?   General: No scleral icterus.    ?   Right eye: No discharge.     ?   Left eye: No discharge.  ?   Conjunctiva/sclera: Conjunctivae normal.  ?Cardiovascular:  ?   Rate and Rhythm: Normal rate and regular rhythm.  ?   Heart sounds:  Normal heart sounds.  ?Pulmonary:  ?   Effort: Pulmonary effort is normal. No respiratory distress.  ?   Breath sounds: Normal breath sounds.  ?Abdominal:  ?   Palpations: Abdomen is soft.  ?   Tenderness: There is no abdominal tenderness. There is no right CVA tenderness or left CVA tenderness.  ?Musculoskeletal:  ?   Cervical back: Neck supple.  ?Skin: ?   General: Skin is dry.  ?Neurological:  ?   General: No focal deficit present.  ?   Mental Status: She is alert. Mental status is at baseline.  ?   Motor: No weakness.  ?   Gait: Gait normal.  ?Psychiatric:     ?   Mood and Affect: Mood normal.     ?   Behavior: Behavior normal.     ?   Thought Content: Thought content normal.  ? ? ? ?UC Treatments / Results  ?Labs ?(all labs ordered are listed, but only abnormal results are displayed) ?Labs Reviewed  ?URINALYSIS, ROUTINE W REFLEX MICROSCOPIC - Abnormal; Notable for the following components:  ?    Result Value  ? Color, Urine ORANGE (*)   ? Glucose, UA 100 (*)   ? Hgb urine dipstick SMALL (*)   ? Nitrite POSITIVE (*)   ? Leukocytes,Ua LARGE (*)   ? All other components within normal limits  ?URINALYSIS, MICROSCOPIC (REFLEX) - Abnormal; Notable for the following components:  ? Bacteria, UA MANY (*)   ? All other components within normal limits  ?URINE CULTURE  ? ? ?EKG ? ? ?Radiology ?No results found. ? ?Procedures ?Procedures (including critical care time) ? ?Medications Ordered in UC ?Medications - No data to display ? ?Initial Impression / Assessment and Plan / UC Course  ?I have reviewed the triage vital signs and the nursing notes. ? ?Pertinent labs & imaging results that were available during my care of the patient were reviewed by me and considered in my medical decision making (see chart for details). ? ?47 year old female presenting for dysuria, Meagan Guzman frequency and urgency x2 days.  History of recurrent UTIs.  Vital stable.  Exam normal.  No CVA tenderness or abdominal tenderness.  UA shows orange urine,  small hemoglobin, positive nitrites and large leukocytes.  We will send urine for culture and treat for UTI.  Patient was treated for UTI last month.  She says she always takes Macrobid because that works well for her.  She has tried other medications with this did not work as well and she would like to take Arabi again.  Sent Macrobid and Pyridium.  Encouraged her to increase rest and fluids.  Patient plans to get a urologist once she gets health insurance.  Advised her to follow-up here as needed. ? ? ?Final Clinical Impressions(s) / UC Diagnoses  ? ?Final diagnoses:  ?Acute cystitis with hematuria  ? ? ? ?Discharge Instructions   ? ?  ?UTI: Based on either symptoms or urinalysis, you may have a urinary tract infection. We will send the urine for culture and call with results in a few days. Begin antibiotics at this time. Your symptoms should be much improved over the next 2-3 days. Increase rest and fluid intake. If for some reason symptoms are worsening or not improving after a couple of days or the urine culture determines the antibiotics you are taking will not treat the infection, the antibiotics may be changed. Return or go to ER for fever, back pain, worsening urinary pain, discharge, increased blood in urine. May take Tylenol or Motrin OTC for pain relief or consider AZO if no contraindications  ? ? ? ? ?ED Prescriptions   ? ? Medication Sig Dispense Auth. Provider  ? nitrofurantoin, macrocrystal-monohydrate, (MACROBID) 100 MG capsule Take 1 capsule (100 mg total) by mouth 2 (two) times daily. 10 capsule Laurene Footman B, PA-C  ? phenazopyridine (PYRIDIUM) 200 MG tablet Take 1 tablet (200 mg total) by mouth 3 (three) times daily. 6 tablet Danton Clap, PA-C  ? ?  ? ?PDMP not reviewed this encounter. ?  ?Danton Clap, PA-C ?12/29/21 1255 ? ?

## 2022-01-01 LAB — URINE CULTURE: Culture: >=100000 — AB

## 2022-01-15 ENCOUNTER — Ambulatory Visit
Admission: EM | Admit: 2022-01-15 | Discharge: 2022-01-15 | Disposition: A | Discharge status: Home / Self Care | Payer: Self-pay | Attending: Emergency Medicine | Admitting: Emergency Medicine

## 2022-01-15 DIAGNOSIS — N3000 Acute cystitis without hematuria: Secondary | ICD-10-CM | POA: Insufficient documentation

## 2022-01-15 LAB — URINALYSIS, MICROSCOPIC (REFLEX)

## 2022-01-15 LAB — URINALYSIS, ROUTINE W REFLEX MICROSCOPIC
Bilirubin Urine: NEGATIVE
Glucose, UA: 100 mg/dL — AB
Hgb urine dipstick: NEGATIVE
Ketones, ur: NEGATIVE mg/dL
Nitrite: POSITIVE — AB
Protein, ur: NEGATIVE mg/dL
Specific Gravity, Urine: <1.005 — ABNORMAL LOW (ref 1.005–1.030)
pH: 5.5 (ref 5.0–8.0)

## 2022-01-15 MED ORDER — CEPHALEXIN 500 MG PO CAPS: 500.0000 mg | ORAL_CAPSULE | Freq: Two times a day (BID) | ORAL | 0 refills | Status: AC

## 2022-01-15 MED ORDER — CEPHALEXIN 500 MG PO CAPS: 500.0000 mg | ORAL_CAPSULE | Freq: Two times a day (BID) | ORAL | 0 refills | Status: DC

## 2022-01-15 NOTE — Discharge Instructions (Signed)
Today positive for infection, your urine has been sent to the lab to determine which bacteria is present, if any changes need to be made to your medication you will be notified ? ?Your antibiotic has been chosen based on the bacteria that was present the last time you had an infection ? ?Take Keflex twice daily for the next 7 days ? ?Continue to increase fluid intake through use of water to further help flush bladder ? ?As always practice good hygiene, wiping from front to back, avoidance of scented vaginal products and urinating after sexual encounters ? ?Please attempt to schedule an appointment with urology who are the bladder specialist for further evaluation for reoccurring infections ?

## 2022-01-15 NOTE — ED Provider Notes (Signed)
?[place] ? ? ? ?CSN: [id number] ?Arrival date & time: 01/15/22  1552 ? ? ?  ? ?History   ?Chief Complaint ?Chief Complaint  ?Patient presents with  ? Back Pain  ? Urinary Tract Infection  ? ? ?HPI ?Meagan Guzman is a 46 y.o. female.  ? ?Patient presents with discomfort in the suprapubic region described as aching, urinary frequency, urgency and dysuria for 3 to 4 days.  Has attempted use of of Azo which has been effective.  Endorses reoccurring urinary infections with the most recent 2 weeks ago.  Was related urinary infections to dehydration after alcohol use however patient endorses that as a preventative measure she has attempted to increase her fluid intake through use of water.  Endorses minimal sexual activity, 1 partner, no condom use.  Denies vaginal symptoms.  Denies fever or chills flank pain, hematuria.  ? ?Past Medical History:  ?Diagnosis Date  ? UTI (lower urinary tract infection)   ? ? ?There are no problems to display for this patient. ? ? ?Past Surgical History:  ?Procedure Laterality Date  ? ABLATION    ? APPENDECTOMY    ? ? ?OB History   ? ? Gravida  ?2  ? Para  ?1  ? Term  ?0  ? Preterm  ?1  ? AB  ?1  ? Living  ?1  ?  ? ? SAB  ?0  ? IAB  ?0  ? Ectopic  ?0  ? Multiple  ?0  ? Live Births  ?   ?   ?  ?  ? ? ? ?Home Medications   ? ?Prior to Admission medications   ?Medication Sig Start Date End Date Taking? Authorizing Provider  ?Multiple Vitamin (MULTI-VITAMIN) tablet Take by mouth.   Yes [provider]  ?nitrofurantoin, macrocrystal-monohydrate, (MACROBID) 100 MG capsule Take 1 capsule (100 mg total) by mouth 2 (two) times daily. 12/29/21  Yes Danton Clap, PA-C  ?phenazopyridine (PYRIDIUM) 200 MG tablet Take 1 tablet (200 mg total) by mouth 3 (three) times daily. 12/29/21  Yes Danton Clap, PA-C  ? ? ?Family History ?Family History  ?Problem Relation Age of Onset  ? Healthy Mother   ? Heart failure Father   ? Heart disease Father   ? Cancer Maternal Grandmother    ? ? ?Social History ?Social History  ? ?Tobacco Use  ? Smoking status: Never  ? Smokeless tobacco: Never  ?Vaping Use  ? Vaping Use: Never used  ?Substance Use Topics  ? Alcohol use: Yes  ?  Comment: occassional  ? Drug use: No  ? ? ? ?Allergies   ?Patient has no known allergies. ? ? ?Review of Systems ?Review of Systems ? ? ?Physical Exam ?Triage Vital Signs ?ED Triage Vitals  ?Enc Vitals Group  ?   BP 01/15/22 1652 117/77  ?   Pulse Rate 01/15/22 1652 (!) 57  ?   Resp 01/15/22 1652 18  ?   Temp 01/15/22 1652 98.4 ?F (36.9 ?C)  ?   Temp Source 01/15/22 1652 Oral  ?   SpO2 01/15/22 1652 100 %  ?   Weight 01/15/22 1650 170 lb (77.1 kg)  ?   Height 01/15/22 1650 5\' 4"  (1.626 m)  ?   Head Circumference --   ?   Peak Flow --   ?   Pain Score 01/15/22 1650 2  ?   Pain Loc --   ?   Pain Edu? --   ?  Excl. in [place]? --   ? ?No data found. ? ?Updated Vital Signs ?BP 117/77 (BP Location: Left Arm)   Pulse (!) 57   Temp 98.4 ?F (36.9 ?C) (Oral)   Resp 18   Ht 5\' 4"  (1.626 m)   Wt 170 lb (77.1 kg)   LMP  (LMP Unknown)   SpO2 100%   BMI 29.18 kg/m?  ? ?Visual Acuity ?Right Eye Distance:   ?Left Eye Distance:   ?Bilateral Distance:   ? ?Right Eye Near:   ?Left Eye Near:    ?Bilateral Near:    ? ?Physical Exam ?Constitutional:   ?   Appearance: Normal appearance.  ?HENT:  ?   Head: Normocephalic.  ?Eyes:  ?   Extraocular Movements: Extraocular movements intact.  ?Pulmonary:  ?   Effort: Pulmonary effort is normal.  ?Abdominal:  ?   General: Abdomen is flat. Bowel sounds are normal.  ?   Palpations: Abdomen is soft.  ?   Tenderness: There is abdominal tenderness in the suprapubic area. There is no right CVA tenderness or left CVA tenderness.  ?Genitourinary: ?   Comments: Deferred ?Skin: ?   General: Skin is warm and dry.  ?Neurological:  ?   Mental Status: She is alert and oriented to person, place, and time. Mental status is at baseline.  ?Psychiatric:     ?   Mood and Affect: Mood normal.     ?   Behavior: Behavior  normal.  ? ? ? ?UC Treatments / Results  ?Labs ?(all labs ordered are listed, but only abnormal results are displayed) ?Labs Reviewed  ?URINALYSIS, ROUTINE W REFLEX MICROSCOPIC  ? ? ?EKG ? ? ?Radiology ?No results found. ? ?Procedures ?Procedures (including critical care time) ? ?Medications Ordered in UC ?Medications - No data to display ? ?Initial Impression / Assessment and Plan / UC Course  ?I have reviewed the triage vital signs and the nursing notes. ? ?Pertinent labs & imaging results that were available during my care of the patient were reviewed by me and considered in my medical decision making (see chart for details). ? ?Acute cystitis without hematuria ? ?Urinalysis positive for nitrates and Voula Waln blood cells, sent for culture, discussed findings with patient, Keflex 500 mg twice daily for 7 days prescribed, extended course due to last occurrence being 2 weeks ago, antibiotic chosen based on urine culture from 12/29/2021, advised increase fluid intake use of water and good feminine hygiene for additional support, given walking referral to urology due to recurrence, may follow-up with urgent care as needed ?Final Clinical Impressions(s) / UC Diagnoses  ? ?Final diagnoses:  ?None  ? ?Discharge Instructions   ?None ?  ? ?ED Prescriptions   ?None ?  ? ?PDMP not reviewed this encounter. ?  ?Hans Eden, NP ?01/15/22 1750 ? ?

## 2022-01-15 NOTE — ED Triage Notes (Signed)
Pt c/o UTI. Ache in pelvic area, urinary burning, back pain.  ? ?Pt is taking AZO.  ?

## 2022-01-17 LAB — URINE CULTURE: Culture: NO GROWTH

## 2022-02-02 ENCOUNTER — Ambulatory Visit (INDEPENDENT_AMBULATORY_CARE_PROVIDER_SITE_OTHER): Payer: Self-pay | Admitting: Urology

## 2022-02-02 ENCOUNTER — Encounter: Payer: Self-pay | Admitting: Urology

## 2022-02-02 VITALS — BP 123/83 | HR 53 | Ht 64.0 in | Wt 180.0 lb

## 2022-02-02 DIAGNOSIS — N958 Other specified menopausal and perimenopausal disorders: Secondary | ICD-10-CM

## 2022-02-02 DIAGNOSIS — Z87440 Personal history of urinary (tract) infections: Secondary | ICD-10-CM

## 2022-02-02 DIAGNOSIS — N390 Urinary tract infection, site not specified: Secondary | ICD-10-CM

## 2022-02-02 MED ORDER — ESTRADIOL 0.1 MG/GM VA CREA
TOPICAL_CREAM | VAGINAL | 12 refills | Status: DC
Start: 2022-02-02 — End: 2022-06-23

## 2022-02-02 MED ORDER — NITROFURANTOIN MONOHYD MACRO 100 MG PO CAPS: 100.0000 mg | ORAL_CAPSULE | Freq: Every day | ORAL | 0 refills | Status: DC

## 2022-02-02 NOTE — Progress Notes (Signed)
? ?  02/02/22 ?2:11 PM  ? ?Meagan Guzman ?Mar 28, 1976 ?[id number] ? ?CC: Recurrent UTIs ? ?HPI: ?46 year old female who reports recurrent UTIs over the last 2 years.  Symptoms with infections or urinary frequency, dysuria, and pelvic pain.  These seem to be worse with heavy drinking.  She had a uterine ablation in 2015 and really has not had periods since then, and over the last 6 months has had no further spotting.  She does not feel infections are associated with sexual activity.  She denies any gross hematuria. ? ? ?PMH: ?Past Medical History:  ?Diagnosis Date  ? UTI (lower urinary tract infection)   ? ? ?Surgical History: ?Past Surgical History:  ?Procedure Laterality Date  ? ABLATION    ? APPENDECTOMY    ? ? ? ? ?Family History: ?Family History  ?Problem Relation Age of Onset  ? Healthy Mother   ? Heart failure Father   ? Heart disease Father   ? Cancer Maternal Grandmother   ? ? ?Social History:  reports that she has never smoked. She has never been exposed to tobacco smoke. She has never used smokeless tobacco. She reports current alcohol use. She reports that she does not use drugs. ? ?Physical Exam: ?BP 123/83   Pulse (!) 53   Ht 5\' 4"  (1.626 m)   Wt 180 lb (81.6 kg)   LMP  (LMP Unknown)   BMI 30.90 kg/m?   ? ?Constitutional:  Alert and oriented, No acute distress. ?Cardiovascular: No clubbing, cyanosis, or edema. ?Respiratory: Normal respiratory effort, no increased work of breathing. ?GI: Abdomen is soft, nontender, nondistended, no abdominal masses ? ?Laboratory Data: ?Reviewed, see HPI ? ?Assessment & Plan:   ?46 year old female with recurrent UTIs over the last 2 years, culture documented E. coli twice in the last 2 months. ? ?We discussed the evaluation and treatment of patients with recurrent UTIs at length.  We specifically discussed the differences between asymptomatic bacteriuria and true urinary tract infection.  We discussed the AUA definition of recurrent UTI of at least 2 culture proven  symptomatic acute cystitis episodes in a 45-month period, or 3 within a 1 year period.  We discussed the importance of culture directed antibiotic treatment, and antibiotic stewardship.  First-line therapy includes nitrofurantoin(5 days), Bactrim(3 days), or fosfomycin(3 g single dose).  Possible etiologies of recurrent infection include periurethral tissue atrophy in postmenopausal woman, constipation, sexual activity, incomplete emptying, anatomic abnormalities, and even genetic predisposition.  Finally, we discussed the role of perineal hygiene, timed voiding, adequate hydration, topical vaginal estrogen, cranberry prophylaxis, and low-dose antibiotic prophylaxis. ? ?-I recommended starting with cranberry tablets, topical estrogen cream, and 90 days nitrofurantoin prophylaxis ?-RTC 4 to 5 months symptom check ? ? ?8-month, MD ?02/02/2022 ? ?[facility] ?[address] ?[place], [place] ?([phone] ? ? ? ?

## 2022-04-26 ENCOUNTER — Other Ambulatory Visit: Payer: Self-pay | Admitting: Urology

## 2022-06-08 ENCOUNTER — Ambulatory Visit: Payer: Self-pay | Admitting: Urology

## 2022-06-19 ENCOUNTER — Encounter: Payer: Self-pay | Admitting: Emergency Medicine

## 2022-06-19 ENCOUNTER — Ambulatory Visit
Admission: EM | Admit: 2022-06-19 | Discharge: 2022-06-19 | Disposition: A | Discharge status: Home / Self Care | Payer: Medicaid Other | Attending: Physician Assistant | Admitting: Physician Assistant

## 2022-06-19 DIAGNOSIS — N3001 Acute cystitis with hematuria: Secondary | ICD-10-CM | POA: Insufficient documentation

## 2022-06-19 LAB — URINALYSIS, MICROSCOPIC (REFLEX)

## 2022-06-19 LAB — URINALYSIS, ROUTINE W REFLEX MICROSCOPIC
Bilirubin Urine: NEGATIVE
Glucose, UA: 100 mg/dL — AB
Ketones, ur: NEGATIVE mg/dL
Nitrite: POSITIVE — AB
Protein, ur: NEGATIVE mg/dL
Specific Gravity, Urine: 1.01 (ref 1.005–1.030)
pH: 7 (ref 5.0–8.0)

## 2022-06-19 MED ORDER — SULFAMETHOXAZOLE-TRIMETHOPRIM 800-160 MG PO TABS: 1.0000 | ORAL_TABLET | Freq: Two times a day (BID) | ORAL | 0 refills | Status: AC

## 2022-06-19 NOTE — Discharge Instructions (Signed)
You were seen for dysuria and are being treated for urinary tract infection.   - We are sending your urine out for a culture. If we need to add or change any medications, our nurse will give you a call to let you know. - Take the antibiotics as prescribed until they're finished. If you think you're having a reaction, stop the medication, take benadryl and go to the nearest urgent care/emergency room. Take a probiotic while taking the antibiotic to decrease the chances of stomach upset.  -Make sure you keep your appointment with your urologist.  Take care, Dr. Marland Kitchen, NP-c

## 2022-06-19 NOTE — ED Triage Notes (Signed)
Patient c/o dysuria that started on Tuesday.  Patient states that she has been taking AZO OTC.

## 2022-06-19 NOTE — ED Provider Notes (Signed)
[facility] - [place], [place]   Name: Meagan Guzman DOB: [birth year] MRN: [id number] CSN: [id number] PCP: Verita Lamb, NP  Arrival date and time:  06/19/22 1334  Chief Complaint:  Dysuria   NOTE: Prior to seeing the patient today, I have reviewed the triage nursing documentation and vital signs. Clinical staff has updated patient's PMH/PSHx, current medication list, and drug allergies/intolerances to ensure comprehensive history available to assist in medical decision making.   History:   HPI: Meagan Guzman is a 46 y.o. female who presents today with complaints of dysuria.  Patient has a known history of frequent UTIs and is currently on the care of her urologist.  She was prescribed a daily low-dose Macrobid, but ran out of this medication in early August.  Has been trying to keep her UTI symptoms at [facility] with over-the-counter Azo and probiotics, but she started noticing painful urination 4 days ago.  Is also noticed abdominal pain and increased frequency.  She denies nausea, chills, vomiting or fevers.  She has an appointment with her urologist on 9/20.   Past Medical History:  Diagnosis Date   UTI (lower urinary tract infection)     Past Surgical History:  Procedure Laterality Date   ABLATION     APPENDECTOMY      Family History  Problem Relation Age of Onset   Healthy Mother    Heart failure Father    Heart disease Father    Cancer Maternal Grandmother     Social History   Tobacco Use   Smoking status: Never    Passive exposure: Never   Smokeless tobacco: Never  Vaping Use   Vaping Use: Never used  Substance Use Topics   Alcohol use: Yes    Comment: occassional   Drug use: No    There are no problems to display for this patient.   Home Medications:    Current Meds  Medication Sig   Multiple Vitamin (MULTI-VITAMIN) tablet Take by mouth.   sulfamethoxazole-trimethoprim (BACTRIM DS) 800-160 MG tablet Take 1 tablet by mouth 2 (two) times daily for  3 days.    Allergies:   Patient has no known allergies.  Review of Systems (ROS): Review of Systems  Constitutional:  Negative for chills, fatigue and fever.  Gastrointestinal:  Negative for abdominal pain, diarrhea, nausea and vomiting.  Genitourinary:  Positive for dysuria, frequency and pelvic pain. Negative for difficulty urinating and flank pain.  Skin:  Negative for color change.  All other systems reviewed and are negative.    Vital Signs: Today's Vitals   06/19/22 1419 06/19/22 1420 06/19/22 1421 06/19/22 1450  BP:   116/79   Pulse:   64   Resp:   14   Temp:   98.2 F (36.8 C)   TempSrc:   Oral   SpO2:   96%   Weight:  165 lb (74.8 kg)    Height:  5\' 4"  (1.626 m)    PainSc: 7    7     Physical Exam: Physical Exam Vitals and nursing note reviewed.  Constitutional:      Appearance: Normal appearance.  Eyes:     Pupils: Pupils are equal, round, and reactive to light.  Cardiovascular:     Rate and Rhythm: Normal rate and regular rhythm.     Pulses: Normal pulses.     Heart sounds: Normal heart sounds.  Pulmonary:     Effort: Pulmonary effort is normal.     Breath sounds: Normal  breath sounds.  Abdominal:     Tenderness: There is abdominal tenderness in the suprapubic area. There is no right CVA tenderness or left CVA tenderness.  Skin:    General: Skin is warm and dry.  Neurological:     General: No focal deficit present.     Mental Status: She is alert and oriented to person, place, and time.  Psychiatric:        Mood and Affect: Mood normal.        Behavior: Behavior normal.      Urgent Care Treatments / Results:   LABS: PLEASE NOTE: all labs that were ordered this encounter are listed, however only abnormal results are displayed. Labs Reviewed  URINALYSIS, ROUTINE W REFLEX MICROSCOPIC - Abnormal; Notable for the following components:      Result Value   Color, Urine AMBER (*)    APPearance HAZY (*)    Glucose, UA 100 (*)    Hgb urine dipstick  TRACE (*)    Nitrite POSITIVE (*)    Leukocytes,Ua SMALL (*)    All other components within normal limits  URINALYSIS, MICROSCOPIC (REFLEX) - Abnormal; Notable for the following components:   Bacteria, UA FEW (*)    All other components within normal limits  URINE CULTURE    EKG: -None  RADIOLOGY: No results found.  PROCEDURES: Procedures  MEDICATIONS RECEIVED THIS VISIT: Medications - No data to display  PERTINENT CLINICAL COURSE NOTES/UPDATES:   Initial Impression / Assessment and Plan / Urgent Care Course:  Pertinent labs & imaging results that were available during my care of the patient were personally reviewed by me and considered in my medical decision making (see lab/imaging section of note for values and interpretations).  Meagan Guzman is a 46 y.o. female who presents to [facility] today with complaints of dysuria, diagnosed with acute cystitis, and treated as such with the medications below. NP and patient reviewed discharge instructions below during visit.   Patient is well appearing overall in clinic today. She does not appear to be in any acute distress. Presenting symptoms (see HPI) and exam as documented above.   I have reviewed the follow up and strict return precautions for any new or worsening symptoms. Patient is aware of symptoms that would be deemed urgent/emergent, and would thus require further evaluation either here or in the emergency department. At the time of discharge, she verbalized understanding and consent with the discharge plan as it was reviewed with her. All questions were fielded by provider and/or clinic staff prior to patient discharge.    Final Clinical Impressions / Urgent Care Diagnoses:   Final diagnoses:  Acute cystitis with hematuria    New Prescriptions:  [place] Controlled Substance Registry consulted? Not Applicable  Meds ordered this encounter  Medications   sulfamethoxazole-trimethoprim (BACTRIM DS) 800-160 MG tablet     Sig: Take 1 tablet by mouth 2 (two) times daily for 3 days.    Dispense:  6 tablet    Refill:  0      Discharge Instructions      You were seen for dysuria and are being treated for urinary tract infection.   - We are sending your urine out for a culture. If we need to add or change any medications, our nurse will give you a call to let you know. - Take the antibiotics as prescribed until they're finished. If you think you're having a reaction, stop the medication, take benadryl and go to the nearest urgent  care/emergency room. Take a probiotic while taking the antibiotic to decrease the chances of stomach upset.  -Make sure you keep your appointment with your urologist.  Take care, Dr. Marland Kitchen, NP-c     Recommended Follow up Care:  Patient encouraged to follow up with the following provider within the specified time frame, or sooner as dictated by the severity of her symptoms. As always, she was instructed that for any urgent/emergent care needs, she should seek care either here or in the emergency department for more immediate evaluation.   Gertie Baron, DNP, NP-c   Gertie Baron, NP 06/19/22 1459

## 2022-06-21 LAB — URINE CULTURE: Culture: 50000 — AB

## 2022-06-23 ENCOUNTER — Ambulatory Visit: Payer: Self-pay | Admitting: Urology

## 2022-06-23 ENCOUNTER — Ambulatory Visit (INDEPENDENT_AMBULATORY_CARE_PROVIDER_SITE_OTHER): Payer: Self-pay | Admitting: Urology

## 2022-06-23 ENCOUNTER — Encounter: Payer: Self-pay | Admitting: Urology

## 2022-06-23 VITALS — BP 111/73 | HR 62 | Ht 64.0 in | Wt 176.0 lb

## 2022-06-23 DIAGNOSIS — N390 Urinary tract infection, site not specified: Secondary | ICD-10-CM

## 2022-06-23 DIAGNOSIS — N958 Other specified menopausal and perimenopausal disorders: Secondary | ICD-10-CM

## 2022-06-23 DIAGNOSIS — Z87440 Personal history of urinary (tract) infections: Secondary | ICD-10-CM

## 2022-06-23 MED ORDER — NITROFURANTOIN MONOHYD MACRO 100 MG PO CAPS: 100.0000 mg | ORAL_CAPSULE | Freq: Every day | ORAL | 0 refills | Status: DC

## 2022-06-23 MED ORDER — ESTRADIOL 0.1 MG/GM VA CREA: TOPICAL_CREAM | VAGINAL | 12 refills | Status: DC

## 2022-06-23 NOTE — Progress Notes (Signed)
   06/23/2022 11:46 AM   Meagan Guzman September 22, 1976 [id number]  Reason for visit: Follow up recurrent UTI  HPI: 46 year old female who I saw in May 2023 for recurrent culture documented E. coli UTIs.  I recommended cranberry tablets, topical estrogen cream, 90 days of nitrofurantoin prophylaxis.  She was not compliant after just a few days of the topical estrogen cream, and did not have any infections on the antibiotic.  She ran out of the low-dose antibiotic and developed a UTI few weeks later on 06/19/2022 with culture documented E. coli.  This resolved with a 3-day course of Bactrim.  I again reviewed the importance of the topical estrogen cream, and the need to use this daily for 1 week, then transition to 3 times weekly.  We reviewed that the low-dose prophylaxis is for prevention as the estrogen cream starts to work, and typically we would not want patients to be on antibiotic prophylaxis daily long-term ideally.  Data was reviewed regarding the benefits of the topical estrogen cream as well as safety.  -Topical estrogen cream daily x1 week, then 3 times weekly, discussed the importance of compliance with this medication and that anticipate would be a long-term med for her -Nitrofurantoin low-dose daily prophylaxis x90 days refilled -RTC 6 months symptom check -If recurrent infections despite above, recommend renal/bladder ultrasound  Billey Co, MD  [name] [address] [place],  [address] [phone]

## 2022-12-21 ENCOUNTER — Ambulatory Visit (INDEPENDENT_AMBULATORY_CARE_PROVIDER_SITE_OTHER): Payer: Self-pay | Admitting: Urology

## 2022-12-21 ENCOUNTER — Encounter: Payer: Self-pay | Admitting: Urology

## 2022-12-21 DIAGNOSIS — N958 Other specified menopausal and perimenopausal disorders: Secondary | ICD-10-CM

## 2022-12-21 DIAGNOSIS — Z87440 Personal history of urinary (tract) infections: Secondary | ICD-10-CM

## 2022-12-21 MED ORDER — ESTRADIOL 0.1 MG/GM VA CREA: TOPICAL_CREAM | VAGINAL | 12 refills | Status: DC

## 2022-12-21 NOTE — Progress Notes (Signed)
   12/21/2022 11:41 AM   Meagan Guzman 08/18/1976 [id number]  Reason for visit: Follow up recurrent UTI, [facility]  HPI: 47 year old female with the above issues, has been doing extremely well on the topical estrogen cream.  She also completed 90 days of nitrofurantoin prophylaxis.  She denies any UTIs since starting the topical estrogen cream regularly, and this has significantly improved her symptoms.  I recommended continuing this long-term.  Also encourage cranberry tablet prophylaxis.  RTC 1 year symptom check and refill, if doing well topical estrogen cream can be filled by PCP moving forward.  Consider renal ultrasound if recurrent infections despite topical estrogen cream  Billey Co, MD  [facility] [address] [place], [place] [address] [phone]

## 2023-07-20 ENCOUNTER — Ambulatory Visit
Admission: EM | Admit: 2023-07-20 | Discharge: 2023-07-20 | Disposition: A | Discharge status: Home / Self Care | Payer: Self-pay | Attending: Physician Assistant | Admitting: Physician Assistant

## 2023-07-20 VITALS — BP 120/77 | HR 64 | Temp 97.6°F | Resp 17

## 2023-07-20 DIAGNOSIS — L509 Urticaria, unspecified: Secondary | ICD-10-CM

## 2023-07-20 DIAGNOSIS — L739 Follicular disorder, unspecified: Secondary | ICD-10-CM

## 2023-07-20 MED ORDER — PREDNISONE 10 MG PO TABS: ORAL_TABLET | ORAL | 0 refills | Status: DC

## 2023-07-20 MED ORDER — DOXYCYCLINE HYCLATE 100 MG PO CAPS: 100.0000 mg | ORAL_CAPSULE | Freq: Two times a day (BID) | ORAL | 0 refills | Status: AC

## 2023-07-20 NOTE — ED Triage Notes (Signed)
Patient states that she got bit by chiggers on her ankles and back of her knees x 1 months ago. She now has a rash all over body,. She's been taking benadryl and cream. She was also wiping bleach on it that helped some.

## 2023-07-20 NOTE — ED Provider Notes (Signed)
[facility]    CSN: [id number] Arrival date & time: 07/20/23  1156      History   Chief Complaint Chief Complaint  Patient presents with   Insect Bite    HPI Meagan Guzman is a 47 y.o. female presenting for 1 week history of tiny erythematous papules of lower legs.  She says she was bitten by chiggers at a funeral a month ago.  Reports repetitively scratching the area.  She says she would scratch so much that the scabs will come off and the area will bleed.  Reports shaving recently and then worsening of rash.  Over the past day she has developed rash spreading to her abdomen, arms and neck which appears differently than the rash on her ankles.  She has applied hydrocortisone cream and taken Benadryl with temporary relief.  Denies any new foods, medications, topicals, detergents, lotions, etc.  No known allergens.  No associated swelling or breathing difficulty.  HPI  Past Medical History:  Diagnosis Date   UTI (lower urinary tract infection)     There are no problems to display for this patient.   Past Surgical History:  Procedure Laterality Date   ABLATION     APPENDECTOMY      OB History     Gravida  2   Para  1   Term  0   Preterm  1   AB  1   Living  1      SAB  0   IAB  0   Ectopic  0   Multiple  0   Live Births               Home Medications    Prior to Admission medications   Medication Sig Start Date End Date Taking? Authorizing Provider  doxycycline (VIBRAMYCIN) 100 MG capsule Take 1 capsule (100 mg total) by mouth 2 (two) times daily for 7 days. 07/20/23 07/27/23 Yes Shirlee Latch, PA-C  predniSONE (DELTASONE) 10 MG tablet Take 6 tabs p.o. on day 1 and decrease by 1 tablet daily until complete 07/20/23  Yes Eusebio Friendly B, PA-C  estradiol (ESTRACE) 0.1 MG/GM vaginal cream Estrogen Cream Instruction Discard applicator Apply pea sized amount to tip of finger to urethra before bed. Wash hands well after application. Use  Monday, Wednesday and Friday 12/21/22   Sondra Come, MD  Multiple Vitamin (MULTI-VITAMIN) tablet Take by mouth.    [provider]  nitrofurantoin, macrocrystal-monohydrate, (MACROBID) 100 MG capsule Take 1 capsule (100 mg total) by mouth daily. 06/23/22   Sondra Come, MD    Family History Family History  Problem Relation Age of Onset   Healthy Mother    Heart failure Father    Heart disease Father    Cancer Maternal Grandmother     Social History Social History   Tobacco Use   Smoking status: Never    Passive exposure: Never   Smokeless tobacco: Never  Vaping Use   Vaping status: Never Used  Substance Use Topics   Alcohol use: Yes    Comment: occassional   Drug use: No     Allergies   Patient has no known allergies.   Review of Systems Review of Systems  Constitutional:  Negative for fatigue and fever.  HENT:  Negative for facial swelling.   Respiratory:  Negative for chest tightness and shortness of breath.   Cardiovascular:  Negative for leg swelling.  Musculoskeletal:  Negative for arthralgias and joint swelling.  Skin:  Positive for rash.  Neurological:  Negative for weakness.     Physical Exam Triage Vital Signs ED Triage Vitals  Encounter Vitals Group     BP 07/20/23 1224 120/77     Systolic BP Percentile --      Diastolic BP Percentile --      Pulse Rate 07/20/23 1224 64     Resp 07/20/23 1224 17     Temp 07/20/23 1224 97.6 F (36.4 C)     Temp Source 07/20/23 1224 Oral     SpO2 07/20/23 1224 99 %     Weight --      Height --      Head Circumference --      Peak Flow --      Pain Score 07/20/23 1223 0     Pain Loc --      Pain Education --      Exclude from Growth Chart --    No data found.  Updated Vital Signs BP 120/77 (BP Location: Right Arm)   Pulse 64   Temp 97.6 F (36.4 C) (Oral)   Resp 17   SpO2 99%      Physical Exam Vitals and nursing note reviewed.  Constitutional:      General: She is not in acute  distress.    Appearance: Normal appearance. She is not ill-appearing or toxic-appearing.  HENT:     Head: Normocephalic and atraumatic.     Nose: Nose normal.     Mouth/Throat:     Mouth: Mucous membranes are moist.     Pharynx: Oropharynx is clear.  Eyes:     General: No scleral icterus.       Right eye: No discharge.        Left eye: No discharge.     Conjunctiva/sclera: Conjunctivae normal.  Cardiovascular:     Rate and Rhythm: Normal rate and regular rhythm.     Heart sounds: Normal heart sounds.  Pulmonary:     Effort: Pulmonary effort is normal. No respiratory distress.     Breath sounds: Normal breath sounds.  Musculoskeletal:     Cervical back: Neck supple.  Skin:    General: Skin is dry.     Findings: Rash present.     Comments: See images included in chart.  The first images of patient's neck.  There is erythematous macular papular rash of the neck, chest, bilateral arms and left abdomen.  Second image is of the left lower leg.  Tiny erythematous papules affecting the hair follicles consistent with folliculitis of bilateral lower legs.  Neurological:     General: No focal deficit present.     Mental Status: She is alert. Mental status is at baseline.     Motor: No weakness.     Gait: Gait normal.  Psychiatric:        Mood and Affect: Mood normal.        Behavior: Behavior normal.        Thought Content: Thought content normal.   LEFT NECK:   RIGHT LOWER LEG    UC Treatments / Results  Labs (all labs ordered are listed, but only abnormal results are displayed) Labs Reviewed - No data to display  EKG   Radiology No results found.  Procedures Procedures (including critical care time)  Medications Ordered in UC Medications - No data to display  Initial Impression / Assessment and Plan / UC Course  I have reviewed the triage vital signs and  the nursing notes.  Pertinent labs & imaging results that were available during my care of the patient were  reviewed by me and considered in my medical decision making (see chart for details).   47 year old female presents for 1 week history of rash of ankles consistent with folliculitis.  See second image above.  Symptoms started after she shaved over chigger bites.  Now she also has a urticarial type rash on her chest, neck and upper extremities.  Has been taking Benadryl and using hydrocortisone cream.  Will start patient on prednisone taper for urticarial rash and doxycycline for folliculitis.  Discussed not shaving until this gets better and then using a new razor head.  Discussed good hygiene and supportive care.  Reviewed return and ER precautions.   Final Clinical Impressions(s) / UC Diagnoses   Final diagnoses:  Folliculitis  Urticaria   Discharge Instructions   None    ED Prescriptions     Medication Sig Dispense Auth. Provider   predniSONE (DELTASONE) 10 MG tablet Take 6 tabs p.o. on day 1 and decrease by 1 tablet daily until complete 21 tablet Eusebio Friendly B, PA-C   doxycycline (VIBRAMYCIN) 100 MG capsule Take 1 capsule (100 mg total) by mouth 2 (two) times daily for 7 days. 14 capsule Shirlee Latch, PA-C      PDMP not reviewed this encounter.   Shirlee Latch, PA-C 07/20/23 1342

## 2023-12-25 NOTE — Progress Notes (Unsigned)
 12/26/2023 9:59 AM   Meagan Guzman March 28, 1976 [id number]  Referring provider: Noralyn Pick, NP [address] [place],  [place] [address]  Urological history: 1. rUTI's -Contributing factors of age and GSM -Documented urine cultures over the last year  None -Vaginal estrogen cream   Chief Complaint  Patient presents with   Recurrent UTI   HPI: Meagan Guzman is a 48 y.o. female who presents today for one year follow up.    Previous records reviewed.   She is having 1-7 daytime voids, 1-2 episodes of nocturia with no urge to urinate.  She has stress urinary incontinence.  She leaks 1-2 times a day.  She wears panty liners daily.  She does not limit fluid intake.  She does not engage in toilet mapping.  Patient denies any modifying or aggravating factors.  Patient denies any recent UTI's, gross hematuria, dysuria or suprapubic/flank pain.  Patient denies any fevers, chills, nausea or vomiting.    She has had 3 urinary tract infections since December treated at a [facility] urgent care.  I do not have those records available to me at this time.  She has relocated to [place], but will keep her urological care with [place].  She is using the vaginal estrogen cream 3 nights weekly.  PVR 236 mL  PMH: Past Medical History:  Diagnosis Date   UTI (lower urinary tract infection)     Surgical History: Past Surgical History:  Procedure Laterality Date   ABLATION     APPENDECTOMY      Home Medications:  Allergies as of 12/26/2023   No Known Allergies      Medication List        Accurate as of December 26, 2023  9:59 AM. If you have any questions, ask your nurse or doctor.          STOP taking these medications    nitrofurantoin (macrocrystal-monohydrate) 100 MG capsule Commonly known as: MACROBID   predniSONE 10 MG tablet Commonly known as: DELTASONE       TAKE these medications    CRANBERRY PO Take by mouth daily.   estradiol 0.1 MG/GM vaginal  cream Commonly known as: ESTRACE Estrogen Cream Instruction Discard applicator Apply pea sized amount to tip of finger to urethra before bed. Wash hands well after application. Use Monday, Wednesday and Friday   levonorgestrel-ethinyl estradiol 0.1-20 MG-MCG tablet Commonly known as: ALESSE Take 1 tablet by mouth daily.   Multi-Vitamin tablet Take by mouth.        Allergies: No Known Allergies  Family History: Family History  Problem Relation Age of Onset   Healthy Mother    Heart failure Father    Heart disease Father    Cancer Maternal Grandmother     Social History:  reports that she has never smoked. She has never been exposed to tobacco smoke. She has never used smokeless tobacco. She reports current alcohol use. She reports that she does not use drugs.  ROS: Pertinent ROS in HPI  Physical Exam: BP 119/80 (BP Location: Left Arm, Patient Position: Sitting, Cuff Size: Normal)   Pulse (!) 54   Ht 5\' 4"  (1.626 m)   Wt 181 lb (82.1 kg)   SpO2 100%   BMI 31.07 kg/m   Constitutional:  Well nourished. Alert and oriented, No acute distress. HEENT: [place] AT, moist mucus membranes.  Trachea midline Cardiovascular: No clubbing, cyanosis, or edema. Respiratory: Normal respiratory effort, no increased work of breathing. Neurologic: Grossly intact, no  focal deficits, moving all 4 extremities. Psychiatric: Normal mood and affect.    Laboratory Data: N/A  Pertinent Imaging:  12/26/23 09:36  Scan Result    Assessment & Plan:    1. rUTI's -She is doing well with her vaginal estrogen cream -She had 3 infections since December and we will have her sign a medical release so we can get the records from the [place] urgent care to see if more studies are indicated for further investigation into why she had these infections  2. GSM -Continue vaginal estrogen cream 3 nights weekly  Return for pending medical records review .  These notes generated with voice  recognition software. I apologize for typographical errors.  Cloretta Ned  [facility] [address]  [address] [place], [place] [address] [phone]

## 2023-12-26 ENCOUNTER — Ambulatory Visit (INDEPENDENT_AMBULATORY_CARE_PROVIDER_SITE_OTHER): Payer: Self-pay | Admitting: Urology

## 2023-12-26 ENCOUNTER — Encounter: Payer: Self-pay | Admitting: Urology

## 2023-12-26 VITALS — BP 119/80 | HR 54 | Ht 64.0 in | Wt 181.0 lb

## 2023-12-26 DIAGNOSIS — N958 Other specified menopausal and perimenopausal disorders: Secondary | ICD-10-CM | POA: Diagnosis not present

## 2023-12-26 DIAGNOSIS — N390 Urinary tract infection, site not specified: Secondary | ICD-10-CM

## 2023-12-26 LAB — BLADDER SCAN AMB NON-IMAGING

## 2023-12-26 MED ORDER — ESTRADIOL 0.1 MG/GM VA CREA: TOPICAL_CREAM | VAGINAL | 12 refills | Status: AC

## 2023-12-26 MED ORDER — ESTRADIOL 0.1 MG/GM VA CREA: TOPICAL_CREAM | VAGINAL | 12 refills | Status: DC

## 2023-12-28 ENCOUNTER — Telehealth: Payer: Self-pay

## 2023-12-28 NOTE — Telephone Encounter (Signed)
 Received a PA for estradiol cream.   Called pt and advised to use [facility]. Advised pt to call if she is unable to p/u her prescription.

## 2023-12-29 NOTE — Telephone Encounter (Signed)
 Patient called and stated she has picked up prescription and it was $43 with the [facility] card.
